# Patient Record
Sex: Female | Born: 1950
Health system: Southern US, Community
[De-identification: ages and names within clinical notes are randomized; demographics above are authoritative.]

## PROBLEM LIST (undated history)

## (undated) ENCOUNTER — Ambulatory Visit

## (undated) DIAGNOSIS — C50919 Malignant neoplasm of unspecified site of unspecified female breast: Secondary | ICD-10-CM

## (undated) DIAGNOSIS — E119 Type 2 diabetes mellitus without complications: Secondary | ICD-10-CM

## (undated) DIAGNOSIS — I249 Acute ischemic heart disease, unspecified: Secondary | ICD-10-CM

## (undated) HISTORY — DX: Malignant neoplasm of unspecified site of unspecified female breast: C50.919

## (undated) HISTORY — PX: MASTECTOMY: SHX3

---

## 1997-06-29 DIAGNOSIS — C50919 Malignant neoplasm of unspecified site of unspecified female breast: Secondary | ICD-10-CM

## 1997-06-29 HISTORY — DX: Malignant neoplasm of unspecified site of unspecified female breast: C50.919

## 1999-07-16 ENCOUNTER — Other Ambulatory Visit: Admission: RE | Admit: 1999-07-16 | Discharge: 1999-07-16 | Payer: Self-pay | Admitting: Family Medicine

## 1999-07-30 ENCOUNTER — Other Ambulatory Visit: Admission: RE | Admit: 1999-07-30 | Discharge: 1999-07-30 | Payer: Self-pay | Admitting: *Deleted

## 1999-09-02 ENCOUNTER — Other Ambulatory Visit: Admission: RE | Admit: 1999-09-02 | Discharge: 1999-09-02 | Payer: Self-pay | Admitting: *Deleted

## 2000-02-05 ENCOUNTER — Encounter: Payer: Self-pay | Admitting: Hematology and Oncology

## 2000-02-05 ENCOUNTER — Encounter: Admission: RE | Admit: 2000-02-05 | Discharge: 2000-02-05 | Payer: Self-pay | Admitting: Hematology and Oncology

## 2000-02-06 ENCOUNTER — Encounter: Payer: Self-pay | Admitting: Hematology and Oncology

## 2000-02-06 ENCOUNTER — Encounter: Admission: RE | Admit: 2000-02-06 | Discharge: 2000-02-06 | Payer: Self-pay | Admitting: Hematology and Oncology

## 2001-04-06 ENCOUNTER — Encounter: Admission: RE | Admit: 2001-04-06 | Discharge: 2001-04-06 | Payer: Self-pay | Admitting: Hematology and Oncology

## 2001-04-06 ENCOUNTER — Encounter: Payer: Self-pay | Admitting: Hematology and Oncology

## 2001-04-08 ENCOUNTER — Encounter: Payer: Self-pay | Admitting: Hematology and Oncology

## 2001-04-08 ENCOUNTER — Encounter: Admission: RE | Admit: 2001-04-08 | Discharge: 2001-04-08 | Payer: Self-pay | Admitting: Hematology and Oncology

## 2002-05-14 ENCOUNTER — Emergency Department (HOSPITAL_COMMUNITY): Admission: EM | Admit: 2002-05-14 | Discharge: 2002-05-15 | Payer: Self-pay | Admitting: Emergency Medicine

## 2002-05-17 ENCOUNTER — Emergency Department (HOSPITAL_COMMUNITY): Admission: EM | Admit: 2002-05-17 | Discharge: 2002-05-17 | Payer: Self-pay | Admitting: Emergency Medicine

## 2003-03-30 ENCOUNTER — Encounter: Payer: Self-pay | Admitting: Hematology & Oncology

## 2003-03-30 ENCOUNTER — Ambulatory Visit (HOSPITAL_COMMUNITY): Admission: RE | Admit: 2003-03-30 | Discharge: 2003-03-30 | Payer: Self-pay | Admitting: Hematology & Oncology

## 2003-08-02 ENCOUNTER — Encounter: Admission: RE | Admit: 2003-08-02 | Discharge: 2003-08-02 | Payer: Self-pay | Admitting: Hematology & Oncology

## 2004-03-13 ENCOUNTER — Encounter (INDEPENDENT_AMBULATORY_CARE_PROVIDER_SITE_OTHER): Payer: Self-pay | Admitting: Radiology

## 2004-03-13 ENCOUNTER — Encounter: Admission: RE | Admit: 2004-03-13 | Discharge: 2004-03-13 | Payer: Self-pay | Admitting: Hematology & Oncology

## 2004-03-19 ENCOUNTER — Encounter (HOSPITAL_COMMUNITY): Admission: RE | Admit: 2004-03-19 | Discharge: 2004-06-17 | Payer: Self-pay | Admitting: Surgery

## 2004-04-04 ENCOUNTER — Ambulatory Visit (HOSPITAL_COMMUNITY): Admission: RE | Admit: 2004-04-04 | Discharge: 2004-04-04 | Payer: Self-pay | Admitting: Hematology & Oncology

## 2004-04-08 ENCOUNTER — Encounter: Admission: RE | Admit: 2004-04-08 | Discharge: 2004-04-08 | Payer: Self-pay | Admitting: Hematology & Oncology

## 2004-04-22 ENCOUNTER — Encounter (INDEPENDENT_AMBULATORY_CARE_PROVIDER_SITE_OTHER): Payer: Self-pay | Admitting: *Deleted

## 2004-04-22 ENCOUNTER — Inpatient Hospital Stay (HOSPITAL_COMMUNITY): Admission: RE | Admit: 2004-04-22 | Discharge: 2004-04-25 | Payer: Self-pay | Admitting: General Surgery

## 2004-05-10 ENCOUNTER — Ambulatory Visit: Payer: Self-pay | Admitting: Hematology & Oncology

## 2004-05-14 ENCOUNTER — Ambulatory Visit (HOSPITAL_COMMUNITY): Admission: RE | Admit: 2004-05-14 | Discharge: 2004-05-14 | Payer: Self-pay | Admitting: Hematology & Oncology

## 2004-05-18 ENCOUNTER — Emergency Department (HOSPITAL_COMMUNITY): Admission: EM | Admit: 2004-05-18 | Discharge: 2004-05-19 | Payer: Self-pay | Admitting: *Deleted

## 2004-06-27 ENCOUNTER — Ambulatory Visit: Payer: Self-pay | Admitting: Hematology & Oncology

## 2004-06-29 HISTORY — PX: MASTECTOMY: SHX3

## 2004-09-25 ENCOUNTER — Ambulatory Visit: Payer: Self-pay | Admitting: Hematology & Oncology

## 2005-01-21 ENCOUNTER — Ambulatory Visit (HOSPITAL_COMMUNITY): Admission: RE | Admit: 2005-01-21 | Discharge: 2005-01-21 | Payer: Self-pay | Admitting: Plastic Surgery

## 2005-01-21 ENCOUNTER — Ambulatory Visit (HOSPITAL_BASED_OUTPATIENT_CLINIC_OR_DEPARTMENT_OTHER): Admission: RE | Admit: 2005-01-21 | Discharge: 2005-01-22 | Payer: Self-pay | Admitting: Plastic Surgery

## 2005-01-21 ENCOUNTER — Encounter (INDEPENDENT_AMBULATORY_CARE_PROVIDER_SITE_OTHER): Payer: Self-pay | Admitting: *Deleted

## 2005-01-30 ENCOUNTER — Ambulatory Visit: Payer: Self-pay | Admitting: Hematology & Oncology

## 2005-03-30 ENCOUNTER — Ambulatory Visit: Payer: Self-pay | Admitting: Hematology & Oncology

## 2005-07-07 ENCOUNTER — Ambulatory Visit: Payer: Self-pay | Admitting: Hematology & Oncology

## 2005-09-29 ENCOUNTER — Ambulatory Visit: Payer: Self-pay | Admitting: Hematology & Oncology

## 2005-09-29 ENCOUNTER — Ambulatory Visit (HOSPITAL_COMMUNITY): Admission: RE | Admit: 2005-09-29 | Discharge: 2005-09-29 | Payer: Self-pay | Admitting: Hematology & Oncology

## 2005-09-29 LAB — CREATININE, SERUM: Creatinine, Ser: 0.9 mg/dL (ref 0.4–1.2)

## 2006-02-12 ENCOUNTER — Encounter: Admission: RE | Admit: 2006-02-12 | Discharge: 2006-02-12 | Payer: Self-pay | Admitting: Hematology & Oncology

## 2006-08-29 ENCOUNTER — Emergency Department (HOSPITAL_COMMUNITY): Admission: EM | Admit: 2006-08-29 | Discharge: 2006-08-29 | Payer: Self-pay | Admitting: Emergency Medicine

## 2006-09-21 ENCOUNTER — Ambulatory Visit: Payer: Self-pay | Admitting: Hematology & Oncology

## 2006-09-23 LAB — CBC WITH DIFFERENTIAL/PLATELET
BASO%: 0.7 % (ref 0.0–2.0)
Eosinophils Absolute: 0.1 10*3/uL (ref 0.0–0.5)
HCT: 38.2 % (ref 34.8–46.6)
LYMPH%: 39.7 % (ref 14.0–48.0)
MONO#: 0.4 10*3/uL (ref 0.1–0.9)
NEUT#: 1.8 10*3/uL (ref 1.5–6.5)
Platelets: 251 10*3/uL (ref 145–400)
RBC: 4.29 10*6/uL (ref 3.70–5.32)
WBC: 3.8 10*3/uL — ABNORMAL LOW (ref 3.9–10.0)
lymph#: 1.5 10*3/uL (ref 0.9–3.3)

## 2006-09-23 LAB — COMPREHENSIVE METABOLIC PANEL
AST: 23 U/L (ref 0–37)
Albumin: 4.2 g/dL (ref 3.5–5.2)
Alkaline Phosphatase: 114 U/L (ref 39–117)
BUN: 10 mg/dL (ref 6–23)
Creatinine, Ser: 0.8 mg/dL (ref 0.40–1.20)
Glucose, Bld: 104 mg/dL — ABNORMAL HIGH (ref 70–99)
Potassium: 3.9 mEq/L (ref 3.5–5.3)
Total Bilirubin: 0.6 mg/dL (ref 0.3–1.2)

## 2007-03-22 ENCOUNTER — Ambulatory Visit: Payer: Self-pay | Admitting: Hematology & Oncology

## 2007-04-01 ENCOUNTER — Encounter: Admission: RE | Admit: 2007-04-01 | Discharge: 2007-04-01 | Payer: Self-pay | Admitting: Hematology & Oncology

## 2007-04-25 LAB — CBC WITH DIFFERENTIAL/PLATELET
Basophils Absolute: 0 10*3/uL (ref 0.0–0.1)
EOS%: 2.4 % (ref 0.0–7.0)
Eosinophils Absolute: 0.1 10*3/uL (ref 0.0–0.5)
HCT: 38.9 % (ref 34.8–46.6)
HGB: 13.6 g/dL (ref 11.6–15.9)
MCH: 31.4 pg (ref 26.0–34.0)
MCV: 89.7 fL (ref 81.0–101.0)
MONO%: 7.8 % (ref 0.0–13.0)
NEUT#: 2.1 10*3/uL (ref 1.5–6.5)
NEUT%: 48.1 % (ref 39.6–76.8)
Platelets: 241 10*3/uL (ref 145–400)
RDW: 14.6 % — ABNORMAL HIGH (ref 11.3–14.5)

## 2007-04-25 LAB — COMPREHENSIVE METABOLIC PANEL
Albumin: 4.3 g/dL (ref 3.5–5.2)
Alkaline Phosphatase: 92 U/L (ref 39–117)
BUN: 13 mg/dL (ref 6–23)
Calcium: 9.4 mg/dL (ref 8.4–10.5)
Chloride: 106 mEq/L (ref 96–112)
Creatinine, Ser: 0.84 mg/dL (ref 0.40–1.20)
Glucose, Bld: 124 mg/dL — ABNORMAL HIGH (ref 70–99)
Potassium: 4.3 mEq/L (ref 3.5–5.3)

## 2007-08-17 ENCOUNTER — Ambulatory Visit: Payer: Self-pay | Admitting: Hematology & Oncology

## 2007-08-19 LAB — COMPREHENSIVE METABOLIC PANEL
Albumin: 4.4 g/dL (ref 3.5–5.2)
BUN: 11 mg/dL (ref 6–23)
Calcium: 9.8 mg/dL (ref 8.4–10.5)
Chloride: 106 mEq/L (ref 96–112)
Glucose, Bld: 109 mg/dL — ABNORMAL HIGH (ref 70–99)
Potassium: 4.1 mEq/L (ref 3.5–5.3)
Sodium: 140 mEq/L (ref 135–145)
Total Protein: 7.4 g/dL (ref 6.0–8.3)

## 2007-08-19 LAB — CBC WITH DIFFERENTIAL/PLATELET
Basophils Absolute: 0 10*3/uL (ref 0.0–0.1)
Eosinophils Absolute: 0.1 10*3/uL (ref 0.0–0.5)
HGB: 13.6 g/dL (ref 11.6–15.9)
MONO#: 0.4 10*3/uL (ref 0.1–0.9)
NEUT#: 2.2 10*3/uL (ref 1.5–6.5)
RDW: 14.1 % (ref 11.3–14.5)
WBC: 4.1 10*3/uL (ref 3.9–10.0)
lymph#: 1.5 10*3/uL (ref 0.9–3.3)

## 2007-08-19 LAB — IRON AND TIBC
Iron: 113 ug/dL (ref 42–145)
UIBC: 234 ug/dL

## 2007-08-20 LAB — TSH: TSH: 0.984 u[IU]/mL (ref 0.350–5.500)

## 2007-10-25 ENCOUNTER — Ambulatory Visit: Payer: Self-pay | Admitting: Hematology & Oncology

## 2007-10-27 LAB — COMPREHENSIVE METABOLIC PANEL
Albumin: 4.3 g/dL (ref 3.5–5.2)
Alkaline Phosphatase: 97 U/L (ref 39–117)
BUN: 11 mg/dL (ref 6–23)
CO2: 25 mEq/L (ref 19–32)
Calcium: 9.5 mg/dL (ref 8.4–10.5)
Glucose, Bld: 96 mg/dL (ref 70–99)
Potassium: 3.9 mEq/L (ref 3.5–5.3)

## 2007-10-27 LAB — CBC WITH DIFFERENTIAL/PLATELET
Basophils Absolute: 0 10*3/uL (ref 0.0–0.1)
Eosinophils Absolute: 0.1 10*3/uL (ref 0.0–0.5)
HGB: 13.8 g/dL (ref 11.6–15.9)
MCV: 90.7 fL (ref 81.0–101.0)
MONO%: 7.4 % (ref 0.0–13.0)
NEUT#: 2.3 10*3/uL (ref 1.5–6.5)
Platelets: 273 10*3/uL (ref 145–400)
RDW: 14.4 % (ref 11.3–14.5)

## 2008-02-18 ENCOUNTER — Emergency Department (HOSPITAL_COMMUNITY): Admission: EM | Admit: 2008-02-18 | Discharge: 2008-02-18 | Payer: Self-pay | Admitting: Emergency Medicine

## 2008-03-14 ENCOUNTER — Ambulatory Visit: Payer: Self-pay | Admitting: Hematology & Oncology

## 2008-04-18 ENCOUNTER — Encounter: Admission: RE | Admit: 2008-04-18 | Discharge: 2008-04-18 | Payer: Self-pay | Admitting: Hematology & Oncology

## 2008-07-17 ENCOUNTER — Ambulatory Visit: Payer: Self-pay | Admitting: Hematology & Oncology

## 2009-01-15 ENCOUNTER — Ambulatory Visit: Payer: Self-pay | Admitting: Hematology & Oncology

## 2009-01-16 LAB — CBC WITH DIFFERENTIAL (CANCER CENTER ONLY)
BASO#: 0 10*3/uL (ref 0.0–0.2)
Eosinophils Absolute: 0.1 10*3/uL (ref 0.0–0.5)
HCT: 39.7 % (ref 34.8–46.6)
HGB: 13.3 g/dL (ref 11.6–15.9)
LYMPH#: 2 10*3/uL (ref 0.9–3.3)
NEUT#: 2.6 10*3/uL (ref 1.5–6.5)
NEUT%: 51.7 % (ref 39.6–80.0)
RBC: 4.33 10*6/uL (ref 3.70–5.32)

## 2009-01-16 LAB — COMPREHENSIVE METABOLIC PANEL
AST: 22 U/L (ref 0–37)
Alkaline Phosphatase: 102 U/L (ref 39–117)
BUN: 9 mg/dL (ref 6–23)
Calcium: 9.7 mg/dL (ref 8.4–10.5)
Creatinine, Ser: 0.8 mg/dL (ref 0.40–1.20)

## 2009-04-05 ENCOUNTER — Observation Stay (HOSPITAL_COMMUNITY): Admission: EM | Admit: 2009-04-05 | Discharge: 2009-04-06 | Payer: Self-pay | Admitting: Emergency Medicine

## 2009-07-16 ENCOUNTER — Ambulatory Visit: Payer: Self-pay | Admitting: Hematology & Oncology

## 2009-07-17 LAB — CBC WITH DIFFERENTIAL (CANCER CENTER ONLY)
BASO#: 0 10*3/uL (ref 0.0–0.2)
Eosinophils Absolute: 0.2 10*3/uL (ref 0.0–0.5)
HGB: 13.6 g/dL (ref 11.6–15.9)
LYMPH%: 45.5 % (ref 14.0–48.0)
MCH: 31.4 pg (ref 26.0–34.0)
MCV: 92 fL (ref 81–101)
MONO%: 4.5 % (ref 0.0–13.0)
RBC: 4.34 10*6/uL (ref 3.70–5.32)

## 2009-07-17 LAB — COMPREHENSIVE METABOLIC PANEL
Albumin: 4.5 g/dL (ref 3.5–5.2)
Alkaline Phosphatase: 97 U/L (ref 39–117)
BUN: 10 mg/dL (ref 6–23)
Glucose, Bld: 129 mg/dL — ABNORMAL HIGH (ref 70–99)
Potassium: 3.9 mEq/L (ref 3.5–5.3)
Total Bilirubin: 0.4 mg/dL (ref 0.3–1.2)

## 2009-07-17 LAB — VITAMIN D 25 HYDROXY (VIT D DEFICIENCY, FRACTURES): Vit D, 25-Hydroxy: 12 ng/mL — ABNORMAL LOW (ref 30–89)

## 2009-12-31 ENCOUNTER — Ambulatory Visit: Payer: Self-pay | Admitting: Hematology & Oncology

## 2010-01-01 LAB — COMPREHENSIVE METABOLIC PANEL
ALT: 28 U/L (ref 0–35)
Albumin: 4.5 g/dL (ref 3.5–5.2)
CO2: 22 mEq/L (ref 19–32)
Calcium: 9.8 mg/dL (ref 8.4–10.5)
Chloride: 105 mEq/L (ref 96–112)
Glucose, Bld: 110 mg/dL — ABNORMAL HIGH (ref 70–99)
Potassium: 4.2 mEq/L (ref 3.5–5.3)
Sodium: 138 mEq/L (ref 135–145)
Total Bilirubin: 0.3 mg/dL (ref 0.3–1.2)
Total Protein: 7.3 g/dL (ref 6.0–8.3)

## 2010-01-01 LAB — CBC WITH DIFFERENTIAL (CANCER CENTER ONLY)
Eosinophils Absolute: 0.2 10*3/uL (ref 0.0–0.5)
LYMPH#: 2.1 10*3/uL (ref 0.9–3.3)
LYMPH%: 44.2 % (ref 14.0–48.0)
MCV: 93 fL (ref 81–101)
MONO#: 0.3 10*3/uL (ref 0.1–0.9)
Platelets: 259 10*3/uL (ref 145–400)
RBC: 4.3 10*6/uL (ref 3.70–5.32)
WBC: 4.7 10*3/uL (ref 3.9–10.0)

## 2010-01-01 LAB — VITAMIN D 25 HYDROXY (VIT D DEFICIENCY, FRACTURES): Vit D, 25-Hydroxy: 18 ng/mL — ABNORMAL LOW (ref 30–89)

## 2010-01-08 ENCOUNTER — Encounter: Admission: RE | Admit: 2010-01-08 | Discharge: 2010-01-08 | Payer: Self-pay | Admitting: Hematology & Oncology

## 2010-07-04 ENCOUNTER — Ambulatory Visit: Payer: Self-pay | Admitting: Hematology & Oncology

## 2010-07-07 ENCOUNTER — Encounter: Payer: Self-pay | Admitting: Gastroenterology

## 2010-07-07 LAB — VITAMIN D 25 HYDROXY (VIT D DEFICIENCY, FRACTURES): Vit D, 25-Hydroxy: 14 ng/mL — ABNORMAL LOW (ref 30–89)

## 2010-07-19 ENCOUNTER — Encounter: Payer: Self-pay | Admitting: Hematology & Oncology

## 2010-07-30 ENCOUNTER — Encounter (INDEPENDENT_AMBULATORY_CARE_PROVIDER_SITE_OTHER): Payer: Self-pay | Admitting: *Deleted

## 2010-07-31 ENCOUNTER — Encounter: Payer: Self-pay | Admitting: Gastroenterology

## 2010-07-31 ENCOUNTER — Ambulatory Visit: Admit: 2010-07-31 | Payer: Self-pay | Admitting: Gastroenterology

## 2010-07-31 NOTE — Letter (Signed)
Summary: Pre Visit Letter Revised  [facility]  [address] [place], [place] [address]   Phone: [phone]  Fax: [phone]        07/07/2010 MRN: [id number]  [facility] [address] [name], [place]  [address]             Procedure Date:  2-15 10am            Direct Colon  Dr Russella Dar   Welcome to the Gastroenterology Division at [facility].    You are scheduled to see a nurse for your pre-procedure visit on 07-31-2010 at 1:30pm on the 3rd floor at [facility], [address]  We ask that you try to arrive at our office 15 minutes prior to your appointment time to allow for check-in.  Please take a minute to review the attached form.  If you answer "Yes" to one or more of the questions on the first page, we ask that you call the person listed at your earliest opportunity.  If you answer "No" to all of the questions, please complete the rest of the form and bring it to your appointment.    Your nurse visit will consist of discussing your medical and surgical history, your immediate family medical history, and your medications.   If you are unable to list all of your medications on the form, please bring the medication bottles to your appointment and we will list them.  We will need to be aware of both prescribed and over the counter drugs.  We will need to know exact dosage information as well.    Please be prepared to read and sign documents such as consent forms, a financial agreement, and acknowledgement forms.  If necessary, and with your consent, a friend or relative is welcome to sit-in on the nurse visit with you.  Please bring your insurance card so that we may make a copy of it.  If your insurance requires a referral to see a specialist, please bring your referral form from your primary care physician.  No co-pay is required for this nurse visit.     If you cannot keep your appointment, please call [phone] to cancel or reschedule  prior to your appointment date.  This allows [place] the opportunity to schedule an appointment for another patient in need of care.    Thank you for choosing [facility] for your medical needs.  We appreciate the opportunity to care for you.  Please visit [place] at our website  to learn more about our practice.  Sincerely, The Gastroenterology Division

## 2010-08-06 NOTE — Letter (Signed)
Summary: [facility] Instructions  [facility]  [address] [place], [place] [address]   Phone: [phone]  Fax: [phone]       [name] [name]    August 01, 1960    MRN: [id number]        Procedure Day Dorna Bloom:  [facility]  08/13/10     Arrival Time:  9:00AM     Procedure Time:  10:00AM     Location of Procedure:                    _ X_  [facility] (4th Floor)                      PREPARATION FOR COLONOSCOPY WITH MOVIPREP   Starting 5 days prior to your procedure 08/08/10 do not eat nuts, seeds, popcorn, corn, beans, peas,  salads, or any raw vegetables.  Do not take any fiber supplements (e.g. Metamucil, Citrucel, and Benefiber).  THE DAY BEFORE YOUR PROCEDURE         DATE: 08/12/10   DAY: TUESDAY  1.  Drink clear liquids the entire day-NO SOLID FOOD  2.  Do not drink anything colored red or purple.  Avoid juices with pulp.  No orange juice.  3.  Drink at least 64 oz. (8 glasses) of fluid/clear liquids during the day to prevent dehydration and help the prep work efficiently.  CLEAR LIQUIDS INCLUDE: Water Jello Ice Popsicles Tea (sugar ok, no milk/cream) Powdered fruit flavored drinks Coffee (sugar ok, no milk/cream) Gatorade Juice: apple, white grape, white cranberry  Lemonade Clear bullion, consomm, broth Carbonated beverages (any kind) Strained chicken noodle soup Hard Candy                             4.  In the morning, mix first dose of MoviPrep solution:    Empty 1 Pouch A and 1 Pouch B into the disposable container    Add lukewarm drinking water to the top line of the container. Mix to dissolve    Refrigerate (mixed solution should be used within 24 hrs)  5.  Begin drinking the prep at 5:00 p.m. The MoviPrep container is divided by 4 marks.   Every 15 minutes drink the solution down to the next mark (approximately 8 oz) until the full liter is complete.   6.  Follow completed prep with 16 oz of clear liquid of your choice  (Nothing red or purple).  Continue to drink clear liquids until bedtime.  7.  Before going to bed, mix second dose of MoviPrep solution:    Empty 1 Pouch A and 1 Pouch B into the disposable container    Add lukewarm drinking water to the top line of the container. Mix to dissolve    Refrigerate  THE DAY OF YOUR PROCEDURE      DATE: 08/13/10   DAY: WEDNESDAY  Beginning at 5:00PM (5 hours before procedure):         1. Every 15 minutes, drink the solution down to the next mark (approx 8 oz) until the full liter is complete.  2. Follow completed prep with 16 oz. of clear liquid of your choice.    3. You may drink clear liquids until 8:00AM (2 HOURS BEFORE PROCEDURE).   MEDICATION INSTRUCTIONS  Unless otherwise instructed, you should take regular prescription medications with a small sip of water   as early as possible the morning  of your procedure.         OTHER INSTRUCTIONS  You will need a responsible adult at least 61 years of age to accompany you and drive you home.   This person must remain in the waiting room during your procedure.  Wear loose fitting clothing that is easily removed.  Leave jewelry and other valuables at home.  However, you may wish to bring a book to read or  an iPod/MP3 player to listen to music as you wait for your procedure to start.  Remove all body piercing jewelry and leave at home.  Total time from sign-in until discharge is approximately 2-3 hours.  You should go home directly after your procedure and rest.  You can resume normal activities the  day after your procedure.  The day of your procedure you should not:   Drive   Make legal decisions   Operate machinery   Drink alcohol   Return to work  You will receive specific instructions about eating, activities and medications before you leave.    The above instructions have been reviewed and explained to me by   Wyona Almas RN  July 31, 2010 1:46 PM     I fully  understand and can verbalize these instructions _____________________________ Date _________

## 2010-08-06 NOTE — Miscellaneous (Signed)
Summary: [facility] PREVISIT/PREP  Clinical Lists Changes  Medications: Added new medication of MOVIPREP 100 GM  SOLR (PEG-KCL-NACL-NASULF-NA ASC-C) As per prep instructions. - Signed Rx of MOVIPREP 100 GM  SOLR (PEG-KCL-NACL-NASULF-NA ASC-C) As per prep instructions.;  #1 x 0;  Signed;  Entered by: Wyona Almas RN;  Authorized by: Meryl Dare MD [facility];  Method used: Print then Give to Patient Observations: Added new observation of NKA: T (07/31/2010 13:23)    Prescriptions: MOVIPREP 100 GM  SOLR (PEG-KCL-NACL-NASULF-NA ASC-C) As per prep instructions.  #1 x 0   Entered by:   Wyona Almas RN   Authorized by:   Meryl Dare MD [facility]   Signed by:   Wyona Almas RN on 07/31/2010   Method used:   Print then Give to Patient   RxID:   [id number]

## 2010-08-13 ENCOUNTER — Encounter: Payer: Self-pay | Admitting: Gastroenterology

## 2010-08-13 ENCOUNTER — Other Ambulatory Visit (AMBULATORY_SURGERY_CENTER): Payer: BC Managed Care – PPO | Admitting: Gastroenterology

## 2010-08-13 DIAGNOSIS — Z1211 Encounter for screening for malignant neoplasm of colon: Secondary | ICD-10-CM

## 2010-08-13 HISTORY — PX: COLONOSCOPY: SHX174

## 2010-08-14 ENCOUNTER — Other Ambulatory Visit: Payer: Self-pay | Admitting: Hematology & Oncology

## 2010-08-14 DIAGNOSIS — Z853 Personal history of malignant neoplasm of breast: Secondary | ICD-10-CM

## 2010-08-20 NOTE — Procedures (Signed)
Summary: Colonoscopy  Patient: Tiwatope Emmitt Note: All result statuses are Final unless otherwise noted.  Tests: (1) Colonoscopy (COL)   COL Colonoscopy           DONE     [facility]     [address]     [place], [place]  [address]           COLONOSCOPY PROCEDURE REPORT           PATIENT:  Genola, Yuille  MR#:  [id number]     BIRTHDATE:  Jan 02, 1951, 59 yrs. old  GENDER:  female     ENDOSCOPIST:  Judie Petit T. Russella Dar, MD, [facility]     Referred by:  Arlan Organ, M.D.     PROCEDURE DATE:  08/13/2010     PROCEDURE:  Colonoscopy 19147     ASA CLASS:  Class II     INDICATIONS:  1) Routine Risk Screening     MEDICATIONS:   Fentanyl 100 mcg IV, Versed 8 mg IV     DESCRIPTION OF PROCEDURE:   After the risks benefits and     alternatives of the procedure were thoroughly explained, informed     consent was obtained.  Digital rectal exam was performed and     revealed no abnormalities.  The LB [id number]-[id number] endoscope     was introduced through the anus and advanced to the cecum, which     was identified by both the appendix and ileocecal valve, limited     by a tortuous colon. The quality of the prep was excellent, using     MoviPrep.  The instrument was then slowly withdrawn as the colon     was fully examined.     <<PROCEDUREIMAGES>>     FINDINGS:  A normal appearing cecum, ileocecal valve, and     appendiceal orifice were identified. The ascending, hepatic     flexure, transverse, splenic flexure, descending, sigmoid colon,     and rectum appeared unremarkable. Retroflexed views in the rectum     revealed no abnormalities. The time to cecum =  5.25  minutes. The     scope was then withdrawn (time =  12  min) from the patient and     the procedure completed.           COMPLICATIONS:  None           ENDOSCOPIC IMPRESSION:     1) Normal colon           RECOMMENDATIONS:     1) Continue current colorectal screening for "routine risk"     patients with a repeat  colonoscopy in 10 years.           Venita Lick. Russella Dar, MD, [facility]           n.     eSIGNED:   Venita Lick. Stark at 08/13/2010 10:38 AM           Bubba Camp, [id number]  Note: An exclamation mark (!) indicates a result that was not dispersed into the flowsheet. Document Creation Date: 08/13/2010 10:39 AM _______________________________________________________________________  (1) Order result status: Final Collection or observation date-time: 08/13/2010 10:32 Requested date-time:  Receipt date-time:  Reported date-time:  Referring Physician:   Ordering Physician: Claudette Head [phone]) Specimen Source:  Source: Launa Grill Order Number: [id number] Lab site:   Appended Document: Colonoscopy    Clinical Lists Changes  Observations: Added new observation of COLONNXTDUE: 07/2020 (08/13/2010 16:55)

## 2010-10-02 LAB — CBC
HCT: 37.1 % (ref 36.0–46.0)
Hemoglobin: 12.5 g/dL (ref 12.0–15.0)
Hemoglobin: 12.7 g/dL (ref 12.0–15.0)
MCHC: 34.1 g/dL (ref 30.0–36.0)
MCV: 93.7 fL (ref 78.0–100.0)
MCV: 94.5 fL (ref 78.0–100.0)
RBC: 3.93 MIL/uL (ref 3.87–5.11)
RDW: 14.6 % (ref 11.5–15.5)
WBC: 5 10*3/uL (ref 4.0–10.5)

## 2010-10-02 LAB — DIFFERENTIAL
Eosinophils Absolute: 0.1 10*3/uL (ref 0.0–0.7)
Lymphs Abs: 1.7 10*3/uL (ref 0.7–4.0)
Monocytes Absolute: 0.4 10*3/uL (ref 0.1–1.0)
Monocytes Relative: 9 % (ref 3–12)
Neutro Abs: 2.7 10*3/uL (ref 1.7–7.7)
Neutrophils Relative %: 54 % (ref 43–77)

## 2010-10-02 LAB — BASIC METABOLIC PANEL
BUN: 10 mg/dL (ref 6–23)
Calcium: 9.4 mg/dL (ref 8.4–10.5)
Creatinine, Ser: 0.81 mg/dL (ref 0.4–1.2)
GFR calc Af Amer: >60 mL/min (ref >60)

## 2010-10-02 LAB — POCT CARDIAC MARKERS
CKMB, poc: 3.9 ng/mL (ref 1.0–8.0)
Myoglobin, poc: 81.6 ng/mL (ref 12–200)
Myoglobin, poc: 87.7 ng/mL (ref 12–200)

## 2010-10-02 LAB — LIPID PANEL
Cholesterol: 231 mg/dL — ABNORMAL HIGH (ref 0–200)
LDL Cholesterol: 153 mg/dL — ABNORMAL HIGH (ref 0–99)

## 2010-10-02 LAB — HEMOGLOBIN A1C: Mean Plasma Glucose: 140 mg/dL

## 2010-10-02 LAB — TROPONIN I: Troponin I: 0.01 ng/mL (ref 0.00–0.06)

## 2010-11-14 NOTE — Discharge Summary (Signed)
NAME:  [name] [name], [name] [name]             ACCOUNT NO.:  [id number]   MEDICAL RECORD NO.:  [id number]          PATIENT TYPE:  INP   LOCATION:  5734                         FACILITY:  [facility]   PHYSICIAN:  Alfredia Ferguson, M.D.  DATE OF BIRTH:  [birth year]   DATE OF ADMISSION:  04/22/2004  DATE OF DISCHARGE:  04/25/2004                                 DISCHARGE SUMMARY   ADMISSION DIAGNOSIS:  Left breast carcinoma, recurrent.   POSTOPERATIVE DIAGNOSIS:  Left breast carcinoma, recurrent.   OPERATION PERFORMED:  1.  Left total mastectomy with sentinel node biopsy.  2.  Immediate breast reconstruction with left transverse rectus abdominis      myocutaneous flap.   CHIEF COMPLAINT:  I have breast cancer that has come back.   HISTORY OF PRESENT ILLNESS:  This is a 60 year old black female who  originally was diagnosed with left breast carcinoma approximately 6 years  prior to this admission.  She was treated with lumpectomy and chemotherapy.  The patient was found on mammogram recently to have new disease which was  biopsied by core needle biopsy and found to be ductal carcinoma.  The  patient is admitted to the hospital at this time for total mastectomy,  sentinel node biopsy and immediate breast reconstruction.   PAST MEDICAL HISTORY:  Negative.   PAST SURGICAL HISTORY:  Lumpectomy approximately 6 years ago in the left  breast.   ALLERGIES:  None.   MEDICATIONS:  None.   PHYSICAL EXAM:  GENERAL:  Please see admission H&P for complete physical  exam.   ADMISSION LABORATORY VALUES:  Admission laboratory values included a  urinalysis which was normal, a CBC, which the patient had a normal  hemoglobin, hematocrit and white count.  She had electrolytes which were  also normal.  Her alkaline phosphatase was elevated at 288.   HOSPITAL COURSE:  On the day of admission, the patient was taken to the  operating room, where she underwent left mastectomy with immediate  reconstruction with a  TRAM flap.  Postoperative course has been completely  uneventful.  The patient was advanced on diet on the first postoperative day  and ambulating on the first postoperative day.  On the second postoperative  day, the patient is relatively independent and able to get to the bathroom  with some assistance.  All incisions look good.  Her reconstructed breast is  soft and of good color.  Drainage is not excessive.  The patient has met  discharge criteria and will be discharged on the 28th of October, on the  third postoperative day.   DISCHARGE INSTRUCTIONS:  Discharge instructions have been provided by my  office.   DISCHARGE MEDICATIONS:  Discharge medications include Vicodin and Keflex.   FOLLOWUP:  Followup will be provided in approximately 5 days following  admission.       WBB/MEDQ  D:  04/24/2004  T:  04/25/2004  Job:  [id number]

## 2010-11-14 NOTE — Op Note (Signed)
NAME:  [name] [name], [name] [name]             ACCOUNT NO.:  [id number]   MEDICAL RECORD NO.:  [id number]          PATIENT TYPE:  AMB   LOCATION:  DSC                          FACILITY:  [facility]   PHYSICIAN:  Alfredia Ferguson, M.D.  DATE OF BIRTH:  February 02, 1951   DATE OF PROCEDURE:  01/21/2005  DATE OF DISCHARGE:                                 OPERATIVE REPORT   PREOP DIAGNOSES:  1.  History of breast cancer.  2.  History of left mastectomy by Dr. Francina Ames.  3.  History of immediate breast reconstruction with TRAM flap.  4.  Fat necrosis of TRAM flap composing approximately 50% of the flap.   POSTOPERATIVE DIAGNOSIS:  Same.   OPERATION PERFORMED:  1.  Debridement of fat necrosis of TRAM.  2.  Placement of subpectoral tissue expander, 350 cc implant inflated to 75      cc at the time of surgery.   SURGEON:  Dr. Benna Dunks   ANESTHESIA:  General laryngeal mask anesthesia.   INDICATIONS FOR SURGERY:  This is a 60 year old woman who was just shy of a  year from left mastectomy and immediate TRAM reconstruction. Her  postoperative course was complicated by vascular compromise to the TRAM with  development of fat necrosis of the medial 25% of the flap and lateral 25% of  the flap.  Progress has stabilized on resolution of fat necrosis. The  patient wishes to now have the remaining fat necrosis removed. She  understands this will be debulking the TRAM by about 50%. The patient is  going to require a tissue expander placed to try to re-expand the  reconstructed breast for placement of permanent implant. The patient was  advised that it would be preferable to do a latissimus flap, but she is not  interested in a latissimus  flap and would like to try the implant  reconstruction. She understands the risk of implants including capsular  contracture, rippling of the implant, infection, bleeding, hematoma, seroma,  malposition of the implant, hardness of the reconstructed breast and overall  dissatisfaction with results.   DESCRIPTION OF SURGERY:  Skin marks were placed outlining natural dimensions  of the breast prior to the patient being taken to the OR. Once in the OR,  she was given general laryngeal mask anesthesia. Chest was prepped with  Betadine and draped with sterile drapes. The original mastectomy incision  was incised and the exposed skin paddle of the TRAM flap was de-  epithelialized. Superior and inferior skin flaps were elevated off of the  TRAM flap. The limits of my fat necrosis were noted and marked. Fat necrosis  was dissected away from the viable TRAM fat. Once dissected away from the  TRAM it was dissected off of the chest wall using electrocautery. The medial  fat necrosis was first removed and measured approximately 10 cm x 3 cm. The  lateral fat necrosis was also identified and marked. It was dissected away  from the remaining viable TRAM fat. Viable tissue was dissected away from  the fat necrosis down to the chest wall. The fat necrosis was now dissected  off of the chest wall from superior to inferior. The segment measured also  approximately 10 cm by about 5 cm.  What was remaining following removal of  the fat necrosis was the central portion of the TRAM which was quite healthy  and had excellent bleeding from the incised edges. I elevated the viable  TRAM off of the chest wall until reaching the rectus muscle. This freed the  superior half of the remaining TRAM tissue. By freeing the superior half  that allowed me to rotate the viable TRAM fat in a counterclockwise position  down into the inferior portion of my mastectomy defect. This filled the  inferior one half of the mastectomy defect. Once rotated the flap was  secured in its new rotated position with multiple interrupted 3-0 Vicryl  sutures. I opted to place tissue expander. My feeling was that the rectus  muscle was quite fibrotic from the previous surgery and putting an implant   underneath would probably not be successful. I incised the pectoralis muscle  in the direction of its fibers for a distance about 8 cm. A subpectoral  space was created using electrocautery large enough to accommodate a 350 cc  tissue expander. The pocket was copiously irrigated with saline irrigation  and hemostasis was meticulously assured. A 350 cc Inamed tissue expander was  prepared by evacuating the air. 75 mL of sterile saline was placed in the  tissue expander. The entire operative site was irrigated with warm saline  irrigation. The tissue expander was placed in a subpectoral position and  placed in the desired location. The pectoralis muscle was reapproximated  with multiple interrupted horizontal mattresses of 3-0 Vicryl suture. 3-0  Blake drain was placed in the lateral axillary limits of my dissection and  brought through separate stab incision. The skin incision was closed under  some tension with multiple interrupted 3-0 Monocryl sutures followed by  running 3-0 Monocryl subcuticular.  Estimated blood loss was only about 50  mL. The patient's chest was cleansed and dried. Light dressing was applied.  The patient was awakened, extubated and transferred to recovery in  satisfactory addition.      Alfredia Ferguson, M.D.  Electronically Signed     WBB/MEDQ  D:  01/21/2005  T:  01/21/2005  Job:  [id number]   cc:   Rose Phi. Young, M.D.  [address], [address]  [place]  [place] [address]

## 2010-11-14 NOTE — Op Note (Signed)
[name], [name]             ACCOUNT NO.:  [id number]   MEDICAL RECORD NO.:  [id number]          PATIENT TYPE:  INP   LOCATION:  2899                         FACILITY:  [facility]   PHYSICIAN:  Alfredia Ferguson, M.D.  DATE OF BIRTH:  1951-04-22   DATE OF PROCEDURE:  04/22/2004  DATE OF DISCHARGE:                                 OPERATIVE REPORT   PREOPERATIVE DIAGNOSES:  1.  History of breast cancer, recurrent.  2.  Acquired absence left breast.   POSTOPERATIVE DIAGNOSES:  1.  History of breast cancer, recurrent.  2.  Acquired absence left breast.   PROCEDURE:  Immediate breast reconstruction with left transverse rectus  abdominis myocutaneous flap, single pedicle flap.   SURGEON:  Alfredia Ferguson, M.D.   FIRST ASSISTANT:  Pam Drown, M.D.   ANESTHESIA:  General endotracheal anesthesia.   INDICATIONS FOR PROCEDURE:  This is a 60 year old black female who has  recurrent left breast carcinoma.  She was originally diagnosed six years ago  and treated with lumpectomy and chemotherapy.  She has now been advised to  undergo mastectomy for the treatment of her recurrent breast cancer.  The  patient wishes to undergo immediate breast reconstruction with a TRAM flap.  She understands the risks of this surgery including bleeding, infection,  hematoma, seroma, unsightly scarring, a reconstructive breast which is too  small or too large compared to the opposite breast, partial fat necrosis,  vascular compromise to the flap with complete loss of the flap, wound  healing problems in the abdomen, seroma in the abdomen, loss of sensation of  the skin, vascular compromise to the umbilicus, dog ears, the need for  revision surgery and overall dissatisfaction with the results.  In spite of  these and other risks discussed, the patient wishes to proceed with surgery.   DESCRIPTION OF PROCEDURE:  On the day prior to surgery, skin marks were  placed to outline the dimensions of the skin  paddle.  Dimensions were also  placed for the skin excision of the mastectomy.  On the day of surgery, the  patient was taken to the operating room where she was given general  endotracheal anesthesia.  The beginning of the surgery was performed by me  because Dr. Maple Hudson was involved in another surgical procedure.  This was as  planned.  The patient's chest and abdomen were prepped with Betadine and  draped with sterile drapes following general endotracheal anesthesia.  A  circular incision was made around the umbilicus and the umbilicus was  dissected away from the surrounding tissue with a healthy cuff of fat to  insure vascular integrity.  The upper skin mark was made along the skin  paddle of the TRAM flap and the upper abdominal flap was dissected off the  anterior rectus fascia and external oblique fascia up to the level of the  costal margins bilaterally and the xyphoid in the midline.  The patient's  back was elevated by lifting the back of the operating table to insure that  I could close the incision at the located mark for the lower skin paddle  incision.  Once I was certain of this.  The patient was placed back in the  supine position and the lower skin paddle incision was made and deepened  until reaching the anterior rectus fascia.  The right lateral corner of the  skin paddle was elevated from lateral to medial direction, lifting it off  the anterior abdominal wall fascia until crossing the midline 1 cm to the  left to the linea alba.  The left corner of the skin paddle was elevated  until reaching the lateral rectus perforators which was approximately 3 cm  medial to the lateral rectus border.  An incision was made in the anterior  rectus fascia along the lateral connection of the skin paddle to the rectus  fascia and this incision was through the anterior rectus fascia until  visualizing the rectus muscle.  An incision was also made at the inferior  connection of the skin  paddle to the rectus fascia visualizing the rectus  muscle.  An incision was made in the medial rectus fascia along the medial  connection of the skin paddle to the rectus fascia.  Two parallel incisions  were made in the upper rectus fascia up to the level of the costal margins  with the two parallel incisions being 2 cm apart.  The lateral incision  continued until connecting  to the previously made incision of the rectus  fascia along the lateral skin flap connection.  The medial was likewise  continued to the medial rectus fascia incision.  At this point, Dr. Maple Hudson  was in the operating room and I temporarily closed the flap incision with  staples and the procedure was turned over to Dr. Maple Hudson.   Following the completion of the mastectomy, I returned to the operating  room.  The anterior rectus fascia was dissected off the rectus muscle using  electrocautery.  The strip of rectus fascia was left connected to the rectus  muscle in the upper portion of the muscle.  The deep surface of the rectus  muscle was dissected out of its anatomic bed.  This freed the muscle  completely with the exception of its attachment to the costal cartilages  superiorly and inferiorly going below the arcuate line.  The deep inferior  epigastric vessels were visualized.  They were visualized inferiorly and  laterally in relation to the muscle.  The artery was dissected away from two  veins and was clipped between multiple hemoclips and divided between the  clips.  The two veins were likewise clipped and divided.  The muscle was  divided just below the arcuate line.  The right side of the skin paddle was  dissected away from the left side.  I opted to keep approximately 3 cm of  the right side of the skin paddle to the right of midline.  I also removed  the left corner of the skin paddle.  This reduced the skin paddle by  approximately 40%.  The connection between the inferior medial dissection of the  mastectomy was made to the superior dissection of the abdominal wound.  Through this connection the skin paddle and rectus muscle were tunneled and  brought up into the mastectomy defect.  A 10 mm Blake drain was placed in  the lateral axillary gutter of the left mastectomy site.  The skin edges of  the mastectomy flap were temporarily stapled.  The abdominal wound was  copiously irrigated with warm saline irrigation.  Hemostasis was  meticulously achieved.  The  rectus fascia was now closed with multiple  interrupted buried figure-of-eight 0 Prolene sutures.  [place] Marlex mesh was  placed over this closure and fixed in position with running 2-0 Prolene  suture.  The wound was again irrigated with saline irrigation.  Hemostasis  was inspected for, and achieved.  Two Blake drains were placed in the  abdominal wound and brought out through separate stab incisions.  An On-Cue  pain pump was placed with double catheters placed in the wound and brought  out through separate stab incisions.  The patient was placed in a semi-  Fowler's position with the back elevated to approximately 30 degrees and the  knees flexed.  The abdominal wound closure was carried out by approximating  the incision in the midline with 2-0 Vicryl sutures.  The remainder of the  incision was temporarily stapled.  A new location was made for the umbilicus  and was incised at this skin mark.  An opening was made until connecting  with the umbilical stump. The umbilicus was brought through this new opening  and fixed in position with multiple interrupted 3-0 Monocryl sutures.  The  abdominal wound was closed with a combination of 2-0 Vicryl sutures for the  dermis and 3-0 Monocryl sutures for the dermis.  The skin edges were closed  with a running 3-0 Monocryl subcuticular.  Attention was directed to the  reconstructed breast.  The previously placed staples were removed and the  TRAM flap was brought through for inspection.   There was noted to be some  venous congestion.  The muscle was inspected and it was a good color.  There  was no tension at the tunnel.  I assume that this was secondary to the  venous choke valves not being open.  The TRAM flap was now cut to fit the  mastectomy site.  Excess tissue was removed.  The skin paddle was  deepithelialized with the exception of the portion of skin I wish to retain.  The upper portion of the paddle was suspended from the superior limits of  the mastectomy dissection with multiple interrupted 3-0 Vicryl sutures.  The  mastectomy incision was now closed with multiple interrupted 3-0 Monocryl  sutures.  The remaining skin on the skin paddle was sutured to the upper and  lower breast flap incisions with multiple interrupted 3-0 Monocryl sutures  followed by running 3-0 Monocryl subcuticular.  The sentinel node incision  in the axilla was closed with multiple interrupted 3-0 Monocryl sutures.  There was also an incision in the upper portion of the breast where the original biopsy site was cored and left connected to the mastectomy  specimen.  This incision was closed in a similar fashion to the sentinel  node incision.  The patient tolerated the procedure well with an estimated  blood  loss of 300 mL.  Her chest and abdomen were cleansed, dried and Steri-Strips  were applied to all incisions.  Light dressings were applied to the breast  and abdomen.  The patient was awakened, extubated and transported to the  recovery room in satisfactory condition.       WBB/MEDQ  D:  04/22/2004  T:  04/22/2004  Job:  [id number]

## 2010-11-14 NOTE — Op Note (Signed)
NAME:  [name] [name], [name] [name]             ACCOUNT NO.:  [id number]   MEDICAL RECORD NO.:  [id number]          PATIENT TYPE:  INP   LOCATION:  2899                         FACILITY:  [facility]   PHYSICIAN:  Rose Phi. Young, M.D.   DATE OF BIRTH:  [birth year]   DATE OF PROCEDURE:  04/22/2004  DATE OF DISCHARGE:                                 OPERATIVE REPORT   PREOPERATIVE DIAGNOSIS:  Recurrent carcinoma of the left breast.   POSTOPERATIVE DIAGNOSIS:  Recurrent carcinoma of the left breast.   OPERATION PERFORMED:  Left total mastectomy with sentinel lymph node biopsy  and blue dye injection.   SURGEON:  Rose Phi. Maple Hudson, M.D.   ASSISTANT:  Anselm Pancoast. Zachery Dakins, M.D.   ANESTHESIA:  General.   DESCRIPTION OF PROCEDURE:  After suitable general anesthesia was induced,  the patient was placed in supine position with the arms extended on the arm  board and the entire abdomen and chest and both breasts were prepped and  draped in the usual fashion.  Dr. Benna Dunks started the work on the flap for  the TRAM reconstruction and then at the time we were available, we came into  the operating room.  I injected 5 mL of a mixture of 2 mL of methylene blue  and 3 mL of injectable saline in the subareolar tissue and gently massaged  the breast for three minutes.   We then made a transverse left axillary incision in the old sentinel lymph  node site and dissected through the subcutaneous tissue to the clavipectoral  fascia and found a hot and blue lymph node which we removed as a sentinel  node.  This was then submitted to the pathologist for evaluation.  There  were no other blue, hot or palpable nodes.  While that was being evaluated,  we had previously outlined what is basically a skin sparing mastectomy and  we made the incision to incorporate in the mastectomy the skin site of the  previous core biopsies.  That was done so that the skin of that would drop  into the mastectomy specimen.  We then made the  elliptical incisions as  previously outlined for the skin sparing mastectomy and then dissected the  medial, superior, inferior and lateral flaps to the sternum, the clavicle,  the inframammary fold at the rectus fascia and laterally to the latissimus  dorsi muscle.  We then removed the breast by dissecting from medial to  lateral incorporating the pectoralis fascia.  At the time we reached the  lateral border of the pectoralis major, the sentinel node report was  available from the pathologist.  It was negative for metastatic disease.  We  then completed the mastectomy by dissecting the lateral chest wall and  removing the breast.  We had hemostasis used with the cautery.   Dr. Benna Dunks then returned to complete the TRAM reconstruction all of which  will be dictated in a separate note.      Pete  PRY/MEDQ  D:  04/22/2004  T:  04/22/2004  Job:  [id number]

## 2011-01-09 ENCOUNTER — Emergency Department (HOSPITAL_COMMUNITY)
Admission: EM | Admit: 2011-01-09 | Discharge: 2011-01-09 | Disposition: A | Discharge status: Home / Self Care | Payer: Self-pay | Attending: Emergency Medicine | Admitting: Emergency Medicine

## 2011-01-09 DIAGNOSIS — R509 Fever, unspecified: Secondary | ICD-10-CM | POA: Insufficient documentation

## 2011-01-09 DIAGNOSIS — R05 Cough: Secondary | ICD-10-CM | POA: Insufficient documentation

## 2011-01-09 DIAGNOSIS — J40 Bronchitis, not specified as acute or chronic: Secondary | ICD-10-CM | POA: Insufficient documentation

## 2011-01-09 DIAGNOSIS — R059 Cough, unspecified: Secondary | ICD-10-CM | POA: Insufficient documentation

## 2011-01-09 DIAGNOSIS — Z853 Personal history of malignant neoplasm of breast: Secondary | ICD-10-CM | POA: Insufficient documentation

## 2011-01-09 DIAGNOSIS — J3489 Other specified disorders of nose and nasal sinuses: Secondary | ICD-10-CM | POA: Insufficient documentation

## 2011-01-13 ENCOUNTER — Ambulatory Visit
Admission: RE | Admit: 2011-01-13 | Discharge: 2011-01-13 | Disposition: A | Discharge status: Home / Self Care | Payer: BC Managed Care – PPO | Source: Ambulatory Visit | Attending: Hematology & Oncology | Admitting: Hematology & Oncology

## 2011-01-13 DIAGNOSIS — Z853 Personal history of malignant neoplasm of breast: Secondary | ICD-10-CM

## 2011-06-16 ENCOUNTER — Telehealth: Payer: Self-pay | Admitting: *Deleted

## 2011-06-16 NOTE — Telephone Encounter (Signed)
Pt called said she was having new problems transferred to RN for triage

## 2011-06-17 ENCOUNTER — Telehealth: Payer: Self-pay | Admitting: *Deleted

## 2011-06-17 ENCOUNTER — Other Ambulatory Visit: Payer: Self-pay | Admitting: *Deleted

## 2011-06-17 NOTE — Telephone Encounter (Signed)
Pt aware of 06-18-11 appointment

## 2011-06-17 NOTE — Telephone Encounter (Signed)
This note was created in error.

## 2011-06-18 ENCOUNTER — Ambulatory Visit (HOSPITAL_BASED_OUTPATIENT_CLINIC_OR_DEPARTMENT_OTHER): Payer: Self-pay | Admitting: Hematology & Oncology

## 2011-06-18 ENCOUNTER — Encounter: Payer: Self-pay | Admitting: Hematology & Oncology

## 2011-06-18 VITALS — BP 124/74 | HR 83 | Temp 97.4°F | Ht 64.0 in | Wt 174.0 lb

## 2011-06-18 DIAGNOSIS — C50919 Malignant neoplasm of unspecified site of unspecified female breast: Secondary | ICD-10-CM | POA: Insufficient documentation

## 2011-06-18 DIAGNOSIS — Z853 Personal history of malignant neoplasm of breast: Secondary | ICD-10-CM

## 2011-06-18 HISTORY — DX: Malignant neoplasm of unspecified site of unspecified female breast: C50.919

## 2011-06-18 NOTE — Progress Notes (Signed)
This office note has been dictated.

## 2011-06-19 ENCOUNTER — Telehealth: Payer: Self-pay | Admitting: *Deleted

## 2011-06-19 NOTE — Telephone Encounter (Signed)
Mailed [phone] schedule

## 2011-06-19 NOTE — Progress Notes (Signed)
CC:   [name] [name], M.D.  DIAGNOSIS:  Locally recurrent ductal carcinoma of the left breast.  CURRENT THERAPY:  Observation.  INTERIM HISTORY:  [name] [name] comes in for her yearly follow-up.  She is doing well.  She had her local recurrence 6 years ago.  She underwent mastectomy of the left breast with reconstruction.  She is doing well.  She has had no problems this year.  She did go for I think on a little vacation.  She has not had any pain.  There is no cough or shortness of breath. She has had no change in bowel or bladder habits.  She has had no headache.  There has been no double vision or blurred vision.  PHYSICAL EXAMINATION:  General Appearance:  This is a well-developed, well-nourished African American female in no obvious distress.  Vital Signs:  Show a temperature of 97.4, pulse 83, respiratory rate 18, blood pressure 124/74.  Weight is 174.  Head and Neck Exam:  Shows a normocephalic, atraumatic skull.  There are no ocular or oral lesions. There are no palpable cervical or supraclavicular lymph nodes.  Lungs: Clear bilaterally.  Cardiac Exam:  Regular rate and rhythm with a normal S1 and S2.  There are no murmurs, rubs or bruits.  Abdominal Exam:  Soft with good bowel sounds.  There is no palpable abdominal mass.  There is no fluid wave.  There is no palpable hepatosplenomegaly.  Back Exam:  No tenderness over the spine, ribs or hips.  Extremities:  Show no clubbing, cyanosis or edema.  Breast Exam:  Shows the right breast with no masses, edema or erythema.  There is no right axillary adenopathy. Left breast shows an implant.  The implant is intact.  No nodularity is noted about the implant.  There is no erythema of the left anterior chest wall.  There is no left axillary adenopathy.  LABORATORY STUDIES:  Labs were not done this visit.  IMPRESSION:  [name] [name] is a 60 year old African American female with a history of locally recurrent infiltrating ductal  carcinoma of the left breast.  She is now out from her recurrence/surgery/chemotherapy by 6 years.  She looks great.  I do not see anything that would suggest a recurrence.  We will go ahead and plan to get her back in 6 months for follow-up.    ______________________________ Josph Macho, M.D. PRE/MEDQ  D:  06/18/2011  T:  06/19/2011  Job:  [id number]

## 2011-08-17 ENCOUNTER — Emergency Department (INDEPENDENT_AMBULATORY_CARE_PROVIDER_SITE_OTHER)
Admission: EM | Admit: 2011-08-17 | Discharge: 2011-08-17 | Disposition: A | Discharge status: Home / Self Care | Payer: Self-pay | Source: Home / Self Care | Attending: Emergency Medicine | Admitting: Emergency Medicine

## 2011-08-17 ENCOUNTER — Encounter (HOSPITAL_COMMUNITY): Payer: Self-pay | Admitting: *Deleted

## 2011-08-17 DIAGNOSIS — W57XXXA Bitten or stung by nonvenomous insect and other nonvenomous arthropods, initial encounter: Secondary | ICD-10-CM

## 2011-08-17 DIAGNOSIS — T148XXA Other injury of unspecified body region, initial encounter: Secondary | ICD-10-CM

## 2011-08-17 DIAGNOSIS — T148 Other injury of unspecified body region: Secondary | ICD-10-CM

## 2011-08-17 MED ORDER — TRIAMCINOLONE ACETONIDE 0.1 % EX CREA: TOPICAL_CREAM | Freq: Two times a day (BID) | CUTANEOUS | Status: DC

## 2011-08-17 MED ORDER — MUPIROCIN 2 % EX OINT: TOPICAL_OINTMENT | Freq: Three times a day (TID) | CUTANEOUS | Status: AC

## 2011-08-17 MED ORDER — FAMOTIDINE 20 MG PO TABS: 20.0000 mg | ORAL_TABLET | Freq: Two times a day (BID) | ORAL | Status: DC

## 2011-08-17 NOTE — Discharge Instructions (Signed)
Take the medication as written. Continue the benadryl. Return if you have a fever >100.4, if you get worse, or for any other concerns.   Insect Bite Mosquitoes, flies, fleas, bedbugs, and many other insects can bite. Insect bites are different from insect stings. A sting is when venom is injected into the skin. Some insect bites can transmit infectious diseases. SYMPTOMS  Insect bites usually turn red, swell, and itch for 2 to 4 days. They often go away on their own. TREATMENT  Your caregiver may prescribe antibiotic medicines if a bacterial infection develops in the bite. HOME CARE INSTRUCTIONS  Do not scratch the bite area.   Keep the bite area clean and dry. Wash the bite area thoroughly with soap and water.   Put ice or cool compresses on the bite area.   Put ice in a plastic bag.   Place a towel between your skin and the bag.   Leave the ice on for 20 minutes, 4 times a day for the first 2 to 3 days, or as directed.   You may apply a baking soda paste, cortisone cream, or calamine lotion to the bite area as directed by your caregiver. This can help reduce itching and swelling.   Only take over-the-counter or prescription medicines as directed by your caregiver.   If you are given antibiotics, take them as directed. Finish them even if you start to feel better.  You may need a tetanus shot if:  You cannot remember when you had your last tetanus shot.   You have never had a tetanus shot.   The injury broke your skin.  If you get a tetanus shot, your arm may swell, get red, and feel warm to the touch. This is common and not a problem. If you need a tetanus shot and you choose not to have one, there is a rare chance of getting tetanus. Sickness from tetanus can be serious. SEEK IMMEDIATE MEDICAL CARE IF:   You have increased pain, redness, or swelling in the bite area.   You see a red line on the skin coming from the bite.   You have a fever.   You have joint pain.   You  have a headache or neck pain.   You have unusual weakness.   You have a rash.   You have chest pain or shortness of breath.   You have abdominal pain, nausea, or vomiting.   You feel unusually tired or sleepy.  Document Released: 07/23/2004 Document Revised: 02/25/2011 Document Reviewed: 01/14/2011 [facility] Patient Information 2012 [place], [place].

## 2011-08-17 NOTE — ED Notes (Signed)
Pt is here with insect bite to right buttocks.  States she was awaken from sleep Friday night with burning like a bee sting.  Developed nausea and itching all over with diarrhea all day today.

## 2011-08-17 NOTE — ED Provider Notes (Signed)
History     CSN: [id number]  Arrival date & time 08/17/11  1133   First MD Initiated Contact with Patient 08/17/11 1237      Chief Complaint  Patient presents with  . Insect Bite    (Consider location/radiation/quality/duration/timing/severity/associated sxs/prior treatment) HPI Comments: Patient reports something "biting" her while in bed 3 nights ago. Reports pain, burning sensation, redness at the area. No other lesions. Nobody else in bed with similar lesions. Did not see what bit her. No fluctuance, discoloration, drainage. No nausea, vomiting, fevers. Reports some nausea starting yesterday, and some loose stools today. No abdominal pain. Has been taking Benadryl for the burning sensation with some improvement. Patient is not a diabetic.  The history is provided by the patient. No language interpreter was used.    Past Medical History  Diagnosis Date  . Recurrent infiltrating ductal carcinoma of breast 06/18/2011    Past Surgical History  Procedure Date  . Mastectomy     History reviewed. No pertinent family history.  History  Substance Use Topics  . Smoking status: Never Smoker   . Smokeless tobacco: Not on file  . Alcohol Use: No    OB History    Grav Para Term Preterm Abortions TAB SAB Ect Mult Living                  Review of Systems  Constitutional: Negative for fever.  Respiratory: Negative for cough and shortness of breath.   Gastrointestinal: Positive for diarrhea. Negative for nausea and vomiting.  Skin: Positive for color change and wound.    Allergies  Review of patient's allergies indicates no known allergies.  Home Medications   Current Outpatient Rx  Name Route Sig Dispense Refill  . DIPHENHYDRAMINE HCL (SLEEP) 25 MG PO TABS Oral Take 25 mg by mouth at bedtime as needed.    Marland Kitchen FAMOTIDINE 20 MG PO TABS Oral Take 1 tablet (20 mg total) by mouth 2 (two) times daily. 10 tablet 0  . MUPIROCIN 2 % EX OINT Topical Apply topically 3 (three)  times daily. Apply after warm soak for 10 minutes 22 g 0  . TRIAMCINOLONE ACETONIDE 0.1 % EX CREA Topical Apply topically 2 (two) times daily. Apply for 2 weeks. May use on face 30 g 0    BP 126/83  Pulse 79  Temp 97.1 F (36.2 C)  Resp 18  SpO2 96%  Physical Exam  Nursing note and vitals reviewed. Constitutional: She is oriented to person, place, and time. She appears well-developed and well-nourished. No distress.  HENT:  Head: Normocephalic and atraumatic.  Eyes: Conjunctivae and EOM are normal.  Neck: Normal range of motion.  Cardiovascular: Normal rate.   Pulmonary/Chest: Effort normal.  Abdominal: She exhibits no distension.  Musculoskeletal: Normal range of motion.  Neurological: She is alert and oriented to person, place, and time.  Skin: Skin is warm and dry.       4 x 4 centimeter area of erythema, swelling right buttocks. No induration, central fluctuance. Skin appears intact. No excoriations, expressible purulent drainage. No other lesions  Psychiatric: She has a normal mood and affect. Her behavior is normal. Judgment and thought content normal.    ED Course  Procedures (including critical care time)  Labs Reviewed - No data to display No results found.   1. Insect bite       MDM  Marked area of erythema with a permanent marker for future reference. Nothing to I&D at this time. Will send  home with antihistamines, steroid cream for symptomatic relief and Bactroban to help prevent secondary infection.  Luiz Blare, MD 08/17/11 1721

## 2011-12-04 ENCOUNTER — Other Ambulatory Visit (HOSPITAL_BASED_OUTPATIENT_CLINIC_OR_DEPARTMENT_OTHER): Payer: Self-pay | Admitting: Lab

## 2011-12-04 ENCOUNTER — Ambulatory Visit: Payer: Self-pay | Admitting: Hematology & Oncology

## 2011-12-08 ENCOUNTER — Telehealth: Payer: Self-pay | Admitting: Hematology & Oncology

## 2011-12-08 NOTE — Telephone Encounter (Signed)
Left patient message to call and reschedule no show from 6-7

## 2011-12-16 ENCOUNTER — Telehealth: Payer: Self-pay | Admitting: Hematology & Oncology

## 2011-12-16 NOTE — Telephone Encounter (Signed)
Pt called and resch 12/04/11 missed apt for 12/22/11

## 2011-12-22 ENCOUNTER — Other Ambulatory Visit: Payer: Self-pay | Admitting: Lab

## 2011-12-22 ENCOUNTER — Ambulatory Visit (HOSPITAL_BASED_OUTPATIENT_CLINIC_OR_DEPARTMENT_OTHER): Payer: Self-pay | Admitting: Hematology & Oncology

## 2011-12-22 VITALS — BP 121/73 | HR 75 | Temp 98.0°F | Ht 64.0 in | Wt 175.0 lb

## 2011-12-22 DIAGNOSIS — Z853 Personal history of malignant neoplasm of breast: Secondary | ICD-10-CM

## 2011-12-22 DIAGNOSIS — C50919 Malignant neoplasm of unspecified site of unspecified female breast: Secondary | ICD-10-CM

## 2011-12-22 DIAGNOSIS — M810 Age-related osteoporosis without current pathological fracture: Secondary | ICD-10-CM

## 2011-12-22 DIAGNOSIS — Z9010 Acquired absence of unspecified breast and nipple: Secondary | ICD-10-CM

## 2011-12-22 LAB — CBC WITH DIFFERENTIAL (CANCER CENTER ONLY)
BASO#: 0 10*3/uL (ref 0.0–0.2)
BASO%: 0.7 % (ref 0.0–2.0)
EOS%: 2.1 % (ref 0.0–7.0)
HGB: 13.6 g/dL (ref 11.6–15.9)
LYMPH#: 2 10*3/uL (ref 0.9–3.3)
MCH: 30.6 pg (ref 26.0–34.0)
MCHC: 34.2 g/dL (ref 32.0–36.0)
MONO%: 8.7 % (ref 0.0–13.0)
NEUT#: 1.9 10*3/uL (ref 1.5–6.5)
Platelets: 240 10*3/uL (ref 145–400)
RDW: 13.8 % (ref 11.1–15.7)

## 2011-12-22 NOTE — Progress Notes (Signed)
This office note has been dictated.

## 2011-12-22 NOTE — Progress Notes (Signed)
CC:   Windle Guard, M.D.  DIAGNOSIS:  Locally recurrent infiltrating ductal carcinoma of the left breast.  CURRENT THERAPY:  Observation.  INTERIM HISTORY:  Ms. Dail comes in for followup.  We see her every 6 months.  She has really done well.  She is going back to school.  She is at [employer].  She made the honor roll.  She will be going back in the fall. She is Glass blower/designer in [employer], [employer].  She has had no problems pain-wise.  She is taking vitamin D.  There is no headache.  She has had no cough or shortness breath.  There is no nausea or vomiting.  There are no fevers, sweats, or chills.  She denies any type of change in bowel or bladder habits.  Her last mammogram was done back over a year ago.  We will have to see why this has not been done recently.  PHYSICAL EXAMINATION:  General:  This is a well-developed, well- nourished black female in no obvious distress.  Vital Signs: Temperature 98, pulse 75, respiratory rate 18, blood pressure 121/73, weight is 175.  Head and Neck Exam:  Shows a normocephalic, atraumatic skull.  There are no ocular or oral lesions.  There are no palpable cervical or supraclavicular lymph nodes.  Lungs:  Clear bilaterally. Cardiac Exam:  Regular rate and rhythm with a normal S1 and S2.  There are no murmurs, rubs, or bruits.  Breast Exam:  Shows the right breast with no masses, edema, or erythema.  There is no right axillary adenopathy.  Left chest wall:  Left breast is reconstructed.  The reconstruction is intact.  No nodules are noted about the left anterior chest wall.  There is no left axillary adenopathy.  Abdomen:  Soft with good bowel sounds.  There is no fluid wave.  There is no palpable hepatosplenomegaly.  Back Exam:  No tenderness over the spine, ribs, or hips.  Extremities:  Show no clubbing, cyanosis, or edema.  Neurological Exam:  Shows no focal neurological deficit.  LABORATORY STUDIES:  White cell count is 4.4, hemoglobin  13.6, hematocrit 39.8, platelet count 240.  IMPRESSION:  Ms. Kopinski is a 61 year old African American female with recurrent ductal carcinoma of the left breast.  She had a recurrence 6- 1/2 years ago.  She has done very, very well.  She was treated with chemotherapy after her mastectomy.  Again, I do not see any evidence of recurrent disease.  We will plan to get her back in 6 more months.  I need to make sure that she does get her mammogram done.    ______________________________ Josph Macho, M.D. PRE/MEDQ  D:  12/22/2011  T:  12/22/2011  Job:  [id number]

## 2011-12-23 LAB — COMPREHENSIVE METABOLIC PANEL
ALT: 37 U/L — ABNORMAL HIGH (ref 0–35)
AST: 20 U/L (ref 0–37)
Albumin: 4.6 g/dL (ref 3.5–5.2)
Alkaline Phosphatase: 96 U/L (ref 39–117)
BUN: 10 mg/dL (ref 6–23)
Calcium: 9.7 mg/dL (ref 8.4–10.5)
Chloride: 103 mEq/L (ref 96–112)
Potassium: 4.1 mEq/L (ref 3.5–5.3)
Sodium: 140 mEq/L (ref 135–145)

## 2011-12-28 ENCOUNTER — Telehealth: Payer: Self-pay | Admitting: *Deleted

## 2011-12-28 NOTE — Telephone Encounter (Signed)
Called patient to let her know that vitamin d level is much better per dr. Lupita Leash

## 2011-12-28 NOTE — Telephone Encounter (Signed)
Message copied by Anselm Jungling on Mon Dec 28, 2011 11:56 AM ------      Message from: Josph Macho      Created: Sun Dec 27, 2011  8:00 PM       Call - vit d is much better.  pete

## 2012-05-04 ENCOUNTER — Emergency Department (INDEPENDENT_AMBULATORY_CARE_PROVIDER_SITE_OTHER)
Admission: EM | Admit: 2012-05-04 | Discharge: 2012-05-04 | Disposition: A | Discharge status: Home / Self Care | Payer: Self-pay | Source: Home / Self Care

## 2012-05-04 ENCOUNTER — Encounter (HOSPITAL_COMMUNITY): Payer: Self-pay

## 2012-05-04 DIAGNOSIS — T59811A Toxic effect of smoke, accidental (unintentional), initial encounter: Secondary | ICD-10-CM

## 2012-05-04 DIAGNOSIS — J705 Respiratory conditions due to smoke inhalation: Secondary | ICD-10-CM

## 2012-05-04 MED ORDER — ALBUTEROL SULFATE HFA 108 (90 BASE) MCG/ACT IN AERS: 2.0000 | INHALATION_SPRAY | RESPIRATORY_TRACT | Status: DC
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
## 2012-05-04 NOTE — ED Notes (Signed)
PULSE  [address]  [address]     SPEAKING IN  COMPLETE  SENTANCES         NO  DISTRESS

## 2012-05-04 NOTE — ED Provider Notes (Signed)
Medical screening examination/treatment/procedure(s) were performed by non-physician practitioner and as supervising physician I was immediately available for consultation/collaboration.  Cayley Pester   Jillian Pianka, MD 05/04/12 1422 
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
## 2012-07-05 ENCOUNTER — Ambulatory Visit: Payer: Self-pay | Admitting: Hematology & Oncology

## 2012-07-05 ENCOUNTER — Other Ambulatory Visit: Payer: Self-pay | Admitting: Lab

## 2012-07-06 ENCOUNTER — Telehealth: Payer: Self-pay | Admitting: Hematology & Oncology

## 2012-07-06 NOTE — Telephone Encounter (Signed)
Pt rescheduled missed 1-6 to 07-25-12. She needed specific days and times. She could only come on mondays, Wednesday and Friday but at different times on those days.

## 2012-07-25 ENCOUNTER — Ambulatory Visit (HOSPITAL_BASED_OUTPATIENT_CLINIC_OR_DEPARTMENT_OTHER): Payer: Self-pay | Admitting: Hematology & Oncology

## 2012-07-25 ENCOUNTER — Other Ambulatory Visit (HOSPITAL_BASED_OUTPATIENT_CLINIC_OR_DEPARTMENT_OTHER): Payer: Self-pay | Admitting: Lab

## 2012-07-25 VITALS — BP 122/59 | HR 93 | Temp 98.2°F | Resp 16 | Ht 64.0 in | Wt 183.0 lb

## 2012-07-25 DIAGNOSIS — M810 Age-related osteoporosis without current pathological fracture: Secondary | ICD-10-CM

## 2012-07-25 DIAGNOSIS — C50919 Malignant neoplasm of unspecified site of unspecified female breast: Secondary | ICD-10-CM

## 2012-07-25 DIAGNOSIS — Z853 Personal history of malignant neoplasm of breast: Secondary | ICD-10-CM

## 2012-07-25 LAB — CBC WITH DIFFERENTIAL (CANCER CENTER ONLY)
Eosinophils Absolute: 0.1 10*3/uL (ref 0.0–0.5)
HCT: 38.2 % (ref 34.8–46.6)
LYMPH%: 35.1 % (ref 14.0–48.0)
MCH: 30.1 pg (ref 26.0–34.0)
MCV: 88 fL (ref 81–101)
MONO#: 0.4 10*3/uL (ref 0.1–0.9)
MONO%: 7.3 % (ref 0.0–13.0)
NEUT%: 55.6 % (ref 39.6–80.0)
RDW: 12.7 % (ref 11.1–15.7)
WBC: 6.1 10*3/uL (ref 3.9–10.0)

## 2012-07-25 NOTE — Progress Notes (Signed)
This office note has been dictated.

## 2012-07-26 LAB — VITAMIN D 25 HYDROXY (VIT D DEFICIENCY, FRACTURES): Vit D, 25-Hydroxy: 49 ng/mL (ref 30–89)

## 2012-07-26 LAB — COMPREHENSIVE METABOLIC PANEL
Alkaline Phosphatase: 114 U/L (ref 39–117)
CO2: 25 mEq/L (ref 19–32)
Creatinine, Ser: 0.92 mg/dL (ref 0.50–1.10)
Glucose, Bld: 375 mg/dL — ABNORMAL HIGH (ref 70–99)
Total Bilirubin: 0.4 mg/dL (ref 0.3–1.2)

## 2012-07-26 NOTE — Progress Notes (Signed)
DIAGNOSIS:  Locally recurrent infiltrating ductal carcinoma of the left breast.  CURRENT THERAPY:  Observation.  INTERIM HISTORY:  [name] [name] comes in for followup.  She is really doing well.  She is back in school.  She made a 4.0 last semester.  She has had no problems with bony pain.  There is no change in bowel or bladder habits.  She has had no leg swelling.  There has been no lymphedema of the left arm.  She has had no bleeding.  There has been no headache.  There has been no double vision or blurred vision.  She is due for her mammogram I think in the summertime.  Last mammogram was back in July.  PHYSICAL EXAMINATION:  General:  This is a well-developed, well- nourished black female in no obvious distress.  Vital signs: Temperature of 98.2, pulse 93, respiratory rate 16, blood pressure 122/59.  Weight is 183.  Head and neck:  Normocephalic, atraumatic skull.  There are no ocular or oral lesions.  There are no palpable cervical or supraclavicular lymph nodes.  Lungs:  Clear bilaterally. Cardiac:  Regular rate and rhythm with a normal S1 and S2.  There are no murmurs, rubs, or bruits.  Abdomen:  Soft with good bowel sounds.  There is no palpable abdominal mass.  There is no palpable hepatosplenomegaly. Breasts:  Right breast no masses, edema, or erythema.  There is no right axillary adenopathy.  Left breast is reconstructed.  She had a mastectomy.  The reconstruction is well taken.  There is no left axillary adenopathy.  Back:  No kyphosis or osteoporotic changes. Extremities:  No clubbing, cyanosis, or edema.  She has good range of motion of her joints.  Neurologic:  No focal neurological deficits. Skin:  No rashes, ecchymosis, or petechia.  Her skin is a little dry.  LABORATORY STUDIES:  White cell count is 6.1, hemoglobin 13.1, hematocrit 38.2, platelet count is 260.  IMPRESSION:  [name] [name] is a very nice 62 year old black female with locally recurrent ductal  carcinoma of the left breast.  She underwent chemotherapy for this.  She had a mastectomy.  She is now out from her recurrence by 7 years.  Again, I do not find any evidence of recurrent disease.  I do not see that we need to do any scans on her.  We will check her lab work.  I told her to take a baby aspirin a day.  I think this is going to be helpful for her.  We will plan see [name] back in another 6 months.    ______________________________ Josph Macho, M.D. PRE/MEDQ  D:  07/25/2012  T:  07/26/2012  Job:  [id number]

## 2012-08-30 ENCOUNTER — Other Ambulatory Visit: Payer: Self-pay

## 2012-08-30 DIAGNOSIS — Z1231 Encounter for screening mammogram for malignant neoplasm of breast: Secondary | ICD-10-CM

## 2012-08-30 DIAGNOSIS — Z9012 Acquired absence of left breast and nipple: Secondary | ICD-10-CM

## 2012-09-06 ENCOUNTER — Ambulatory Visit
Admission: RE | Admit: 2012-09-06 | Discharge: 2012-09-06 | Disposition: A | Discharge status: Home / Self Care | Payer: BC Managed Care – PPO | Source: Ambulatory Visit

## 2012-09-06 DIAGNOSIS — Z1231 Encounter for screening mammogram for malignant neoplasm of breast: Secondary | ICD-10-CM

## 2012-09-06 DIAGNOSIS — Z9012 Acquired absence of left breast and nipple: Secondary | ICD-10-CM

## 2013-01-23 ENCOUNTER — Ambulatory Visit (HOSPITAL_BASED_OUTPATIENT_CLINIC_OR_DEPARTMENT_OTHER): Payer: BC Managed Care – PPO | Admitting: Hematology & Oncology

## 2013-01-23 ENCOUNTER — Other Ambulatory Visit (HOSPITAL_BASED_OUTPATIENT_CLINIC_OR_DEPARTMENT_OTHER): Payer: BC Managed Care – PPO | Admitting: Lab

## 2013-01-23 VITALS — BP 119/61 | HR 86 | Temp 98.9°F | Resp 16 | Ht 64.0 in | Wt 178.0 lb

## 2013-01-23 DIAGNOSIS — C50919 Malignant neoplasm of unspecified site of unspecified female breast: Secondary | ICD-10-CM

## 2013-01-23 DIAGNOSIS — M810 Age-related osteoporosis without current pathological fracture: Secondary | ICD-10-CM

## 2013-01-23 DIAGNOSIS — C50912 Malignant neoplasm of unspecified site of left female breast: Secondary | ICD-10-CM

## 2013-01-23 LAB — CBC WITH DIFFERENTIAL (CANCER CENTER ONLY)
BASO#: 0 10*3/uL (ref 0.0–0.2)
BASO%: 0.6 % (ref 0.0–2.0)
Eosinophils Absolute: 0.1 10*3/uL (ref 0.0–0.5)
HCT: 37.7 % (ref 34.8–46.6)
HGB: 12.7 g/dL (ref 11.6–15.9)
LYMPH#: 1.9 10*3/uL (ref 0.9–3.3)
MONO#: 0.3 10*3/uL (ref 0.1–0.9)
NEUT%: 52.6 % (ref 39.6–80.0)
RBC: 4.2 10*6/uL (ref 3.70–5.32)
WBC: 4.8 10*3/uL (ref 3.9–10.0)

## 2013-01-23 NOTE — Progress Notes (Signed)
This office note has been dictated.

## 2013-01-24 LAB — COMPREHENSIVE METABOLIC PANEL
ALT: 27 U/L (ref 0–35)
BUN: 10 mg/dL (ref 6–23)
CO2: 25 mEq/L (ref 19–32)
Calcium: 9.4 mg/dL (ref 8.4–10.5)
Chloride: 106 mEq/L (ref 96–112)
Creatinine, Ser: 0.82 mg/dL (ref 0.50–1.10)
Glucose, Bld: 248 mg/dL — ABNORMAL HIGH (ref 70–99)

## 2013-01-24 LAB — LACTATE DEHYDROGENASE: LDH: 138 U/L (ref 94–250)

## 2013-01-24 NOTE — Progress Notes (Signed)
CC:   Windle Guard, M.D.  DIAGNOSIS:  Locally-recurrent ductal carcinoma of the left breast.  CURRENT THERAPY:  Observation.  INTERIM HISTORY:  Ms. Hannay comes in for followup.  We see her every 6 months.  She is doing well.  She has had no problems since we last saw her.  She has been traveling quite a bit.  She has been enjoying all this.  She did have a mammogram in March.  The mammogram looked good with no evidence of suspicious microcalcifications.  There has been no change in bowel or bladder habits.  She has had no problems with leg swelling.  She has had no rashes.  When we last saw her in January, her vitamin D was 49.  PHYSICAL EXAMINATION:  General:  This is a well-developed, well- nourished African American female in no obvious distress.  Vital signs: Temperature of 98.9, pulse 86, respiratory rate 16, blood pressure 119/61.  Weight is 178.  Head and neck:  Normocephalic, atraumatic skull.  There are no ocular or oral lesions.  There are no palpable cervical or supraclavicular lymph nodes.  Lungs:  Clear bilaterally. Cardiac:  Regular rate and rhythm with a normal S1 and S2.  There are no murmurs, rubs or bruits.  Breasts:  Right breast with no masses, edema or erythema.  There is no right axillary adenopathy.  Left breast is reconstructed.  She had a mastectomy.  The reconstruction site is well healed.  No masses are noted.  There is no surrounding erythema or nodularity.  There is no left axillary adenopathy.  Abdomen:  Soft.  She has good bowel sounds.  There is no fluid wave.  There is no palpable hepatosplenomegaly.  Back:  Shows no tenderness over the spine, ribs, or hips.  Extremities:  Show no clubbing, cyanosis or edema.  Specifically, there is no lymphedema in the left arm.  Neurological:  Shows no focal neurological deficits.  LABORATORY STUDIES:  White cell count 4.8, hemoglobin 12.7, hematocrit 37.7, platelet count 255,000.  IMPRESSION:  Ms. Schnyder  is a very charming 62 year old African American female with history of locally-recurrent ductal carcinoma of the left breast.  She underwent chemotherapy for this.  She is doing quite well. There is no evidence of recurrence today.  It has now been about 8 years.  We will plan for another 51-month followup.  I think once we get to 10 years, then we can move her to 1-year followup.   ______________________________ Josph Macho, M.D. PRE/MEDQ  D:  01/23/2013  T:  01/24/2013  Job:  [id number]

## 2013-07-26 ENCOUNTER — Ambulatory Visit (HOSPITAL_BASED_OUTPATIENT_CLINIC_OR_DEPARTMENT_OTHER): Payer: No Typology Code available for payment source | Admitting: Hematology & Oncology

## 2013-07-26 ENCOUNTER — Other Ambulatory Visit (HOSPITAL_BASED_OUTPATIENT_CLINIC_OR_DEPARTMENT_OTHER): Payer: No Typology Code available for payment source | Admitting: Lab

## 2013-07-26 ENCOUNTER — Encounter: Payer: Self-pay | Admitting: Hematology & Oncology

## 2013-07-26 VITALS — BP 133/62 | HR 78 | Temp 98.0°F | Resp 14 | Ht 64.0 in | Wt 176.0 lb

## 2013-07-26 DIAGNOSIS — C50919 Malignant neoplasm of unspecified site of unspecified female breast: Secondary | ICD-10-CM

## 2013-07-26 DIAGNOSIS — Z87898 Personal history of other specified conditions: Secondary | ICD-10-CM

## 2013-07-26 DIAGNOSIS — C50912 Malignant neoplasm of unspecified site of left female breast: Secondary | ICD-10-CM

## 2013-07-26 LAB — CBC WITH DIFFERENTIAL (CANCER CENTER ONLY)
BASO#: 0.1 10*3/uL (ref 0.0–0.2)
BASO%: 1.2 % (ref 0.0–2.0)
EOS%: 2.1 % (ref 0.0–7.0)
Eosinophils Absolute: 0.1 10*3/uL (ref 0.0–0.5)
HEMATOCRIT: 38 % (ref 34.8–46.6)
HEMOGLOBIN: 12.6 g/dL (ref 11.6–15.9)
LYMPH#: 1.8 10*3/uL (ref 0.9–3.3)
LYMPH%: 41.7 % (ref 14.0–48.0)
MCH: 29.9 pg (ref 26.0–34.0)
MCHC: 33.2 g/dL (ref 32.0–36.0)
MCV: 90 fL (ref 81–101)
MONO#: 0.3 10*3/uL (ref 0.1–0.9)
MONO%: 6.1 % (ref 0.0–13.0)
NEUT#: 2.1 10*3/uL (ref 1.5–6.5)
NEUT%: 48.9 % (ref 39.6–80.0)
Platelets: 231 10*3/uL (ref 145–400)
RBC: 4.22 10*6/uL (ref 3.70–5.32)
RDW: 13.4 % (ref 11.1–15.7)
WBC: 4.3 10*3/uL (ref 3.9–10.0)

## 2013-07-26 LAB — COMPREHENSIVE METABOLIC PANEL
ALT: 37 U/L — ABNORMAL HIGH (ref 0–35)
AST: 23 U/L (ref 0–37)
Albumin: 4.1 g/dL (ref 3.5–5.2)
Alkaline Phosphatase: 98 U/L (ref 39–117)
BILIRUBIN TOTAL: 0.4 mg/dL (ref 0.2–1.2)
BUN: 10 mg/dL (ref 6–23)
CALCIUM: 9 mg/dL (ref 8.4–10.5)
CHLORIDE: 100 meq/L (ref 96–112)
CO2: 27 meq/L (ref 19–32)
CREATININE: 0.82 mg/dL (ref 0.50–1.10)
Glucose, Bld: 250 mg/dL — ABNORMAL HIGH (ref 70–99)
Potassium: 3.9 mEq/L (ref 3.5–5.3)
Sodium: 135 mEq/L (ref 135–145)
Total Protein: 6.8 g/dL (ref 6.0–8.3)

## 2013-07-26 NOTE — Progress Notes (Signed)
This office note has been dictated.

## 2013-07-29 NOTE — Progress Notes (Signed)
CC:   Claris Gower, M.D.  DIAGNOSIS:  Locally recurrent infiltrating ductal carcinoma of the left breast.  CURRENT THERAPY:  Observation.  INTERIM HISTORY:  Ms. Turkington comes in for followup.  We see her every 6 months.  Since we last saw her, she has been doing quite well.  She has really had no problems since we last saw her.  She has had no fatigue or weakness.  There is no cough or shortness of breath.  She has had a good appetite.  There is no change in bowel or bladder habits.  She has not noticed any leg swelling.  She has had no rashes.  There has been no change in her medications.  She is diabetic and on Glucophage.  PHYSICAL EXAMINATION:  General:  This is a well-developed, well- nourished African American female in no obvious distress.  Vital Signs: Temperature of 98, pulse 78, respiratory rate 14, blood pressure 133/62, weight is 176 pounds.  Head and Neck:  Normocephalic, atraumatic skull. There are no ocular or oral lesions.  There are no palpable cervical or supraclavicular lymph nodes.  Lungs:  Clear bilaterally.  Cardiac: Regular rate and rhythm with normal S1, S2.  There are no murmurs, rubs, or bruits.  Abdomen:  Soft.  She has good bowel sounds.  There is no fluid wave.  There is no palpable hepatosplenomegaly.  Breasts:  Right breast with no masses, edema, or erythema.  There is no right axillary adenopathy.  Left chest wall shows well-healed mastectomy.  She has some radiation therapy changes on the left anterior chest wall.  No nodules are noted on the left anterior chest wall.  There is no left axillary adenopathy.  Extremities:  No clubbing, cyanosis, or edema.  There may be some slight lymphedema of the left arm.  She has good range of motion of her joints.  Neurologic:  No focal neurological deficits.  LABORATORY STUDIES:  White cell count is 4.3, hemoglobin 12.6, hematocrit 38, platelet count 231.  MCV is 90.  Her blood sugar is 250.  IMPRESSION:   Ms. Wayment is a nice 63 year old African American female. She has history of locally recurrent ductal carcinoma of left breast. She underwent mastectomy.  She had chemotherapy for this.  So far, I see no evidence of recurrence.  This now been, I think, over 8 years since she had the episode.  For now, we will plan to get her back in another 6 months.  I do not see that we need to do any scans on her in between visits.    ______________________________ Volanda Napoleon, M.D. PRE/MEDQ  D:  07/28/2013  T:  07/29/2013  Job:  [id number]

## 2013-09-12 ENCOUNTER — Other Ambulatory Visit: Payer: Self-pay

## 2013-09-12 ENCOUNTER — Ambulatory Visit
Admission: RE | Admit: 2013-09-12 | Discharge: 2013-09-12 | Disposition: A | Discharge status: Home / Self Care | Payer: No Typology Code available for payment source | Source: Ambulatory Visit

## 2013-09-12 DIAGNOSIS — Z1231 Encounter for screening mammogram for malignant neoplasm of breast: Secondary | ICD-10-CM

## 2013-09-12 DIAGNOSIS — Z9012 Acquired absence of left breast and nipple: Secondary | ICD-10-CM

## 2013-10-22 ENCOUNTER — Encounter (HOSPITAL_COMMUNITY): Payer: Self-pay | Admitting: Emergency Medicine

## 2013-10-22 ENCOUNTER — Emergency Department (HOSPITAL_COMMUNITY): Payer: No Typology Code available for payment source

## 2013-10-22 ENCOUNTER — Emergency Department (HOSPITAL_COMMUNITY)
Admission: EM | Admit: 2013-10-22 | Discharge: 2013-10-22 | Disposition: A | Discharge status: Home / Self Care | Payer: No Typology Code available for payment source | Attending: Emergency Medicine | Admitting: Emergency Medicine

## 2013-10-22 ENCOUNTER — Emergency Department (HOSPITAL_COMMUNITY)
Admission: EM | Admit: 2013-10-22 | Discharge: 2013-10-22 | Disposition: A | Discharge status: Nursing Home (Includes ICF) | Payer: No Typology Code available for payment source | Source: Home / Self Care | Attending: Family Medicine | Admitting: Family Medicine

## 2013-10-22 DIAGNOSIS — R1031 Right lower quadrant pain: Secondary | ICD-10-CM

## 2013-10-22 DIAGNOSIS — Z79899 Other long term (current) drug therapy: Secondary | ICD-10-CM | POA: Insufficient documentation

## 2013-10-22 DIAGNOSIS — Z9221 Personal history of antineoplastic chemotherapy: Secondary | ICD-10-CM | POA: Insufficient documentation

## 2013-10-22 DIAGNOSIS — R110 Nausea: Secondary | ICD-10-CM | POA: Insufficient documentation

## 2013-10-22 DIAGNOSIS — R109 Unspecified abdominal pain: Secondary | ICD-10-CM

## 2013-10-22 DIAGNOSIS — Z7982 Long term (current) use of aspirin: Secondary | ICD-10-CM | POA: Insufficient documentation

## 2013-10-22 DIAGNOSIS — E278 Other specified disorders of adrenal gland: Secondary | ICD-10-CM | POA: Insufficient documentation

## 2013-10-22 DIAGNOSIS — E279 Disorder of adrenal gland, unspecified: Secondary | ICD-10-CM

## 2013-10-22 DIAGNOSIS — Z853 Personal history of malignant neoplasm of breast: Secondary | ICD-10-CM | POA: Insufficient documentation

## 2013-10-22 LAB — COMPREHENSIVE METABOLIC PANEL
ALT: 32 U/L (ref 0–35)
AST: 22 U/L (ref 0–37)
Albumin: 4.2 g/dL (ref 3.5–5.2)
Alkaline Phosphatase: 99 U/L (ref 39–117)
BILIRUBIN TOTAL: 0.4 mg/dL (ref 0.3–1.2)
BUN: 9 mg/dL (ref 6–23)
CHLORIDE: 98 meq/L (ref 96–112)
CO2: 25 mEq/L (ref 19–32)
CREATININE: 0.74 mg/dL (ref 0.50–1.10)
Calcium: 9.8 mg/dL (ref 8.4–10.5)
GFR calc non Af Amer: 89 mL/min — ABNORMAL LOW (ref >90)
GLUCOSE: 137 mg/dL — AB (ref 70–99)
Potassium: 4 mEq/L (ref 3.7–5.3)
Sodium: 139 mEq/L (ref 137–147)
Total Protein: 7.9 g/dL (ref 6.0–8.3)

## 2013-10-22 LAB — POCT URINALYSIS DIP (DEVICE)
BILIRUBIN URINE: NEGATIVE
GLUCOSE, UA: NEGATIVE mg/dL
HGB URINE DIPSTICK: NEGATIVE
KETONES UR: NEGATIVE mg/dL
Leukocytes, UA: NEGATIVE
Nitrite: NEGATIVE
Protein, ur: NEGATIVE mg/dL
SPECIFIC GRAVITY, URINE: 1.01 (ref 1.005–1.030)
UROBILINOGEN UA: 1 mg/dL (ref 0.0–1.0)
pH: 7 (ref 5.0–8.0)

## 2013-10-22 LAB — CBC WITH DIFFERENTIAL/PLATELET
Basophils Absolute: 0 10*3/uL (ref 0.0–0.1)
Basophils Relative: 1 % (ref 0–1)
EOS ABS: 0 10*3/uL (ref 0.0–0.7)
Eosinophils Relative: 1 % (ref 0–5)
HEMATOCRIT: 40.1 % (ref 36.0–46.0)
HEMOGLOBIN: 13.9 g/dL (ref 12.0–15.0)
LYMPHS ABS: 2.2 10*3/uL (ref 0.7–4.0)
Lymphocytes Relative: 37 % (ref 12–46)
MCH: 30.7 pg (ref 26.0–34.0)
MCHC: 34.7 g/dL (ref 30.0–36.0)
MCV: 88.5 fL (ref 78.0–100.0)
MONO ABS: 0.4 10*3/uL (ref 0.1–1.0)
MONOS PCT: 7 % (ref 3–12)
NEUTROS PCT: 54 % (ref 43–77)
Neutro Abs: 3.3 10*3/uL (ref 1.7–7.7)
Platelets: 267 10*3/uL (ref 150–400)
RBC: 4.53 MIL/uL (ref 3.87–5.11)
RDW: 13.6 % (ref 11.5–15.5)
WBC: 6.1 10*3/uL (ref 4.0–10.5)

## 2013-10-22 LAB — URINALYSIS, ROUTINE W REFLEX MICROSCOPIC
Bilirubin Urine: NEGATIVE
Glucose, UA: NEGATIVE mg/dL
Hgb urine dipstick: NEGATIVE
KETONES UR: NEGATIVE mg/dL
Leukocytes, UA: NEGATIVE
Nitrite: NEGATIVE
Protein, ur: NEGATIVE mg/dL
Specific Gravity, Urine: 1.011 (ref 1.005–1.030)
UROBILINOGEN UA: 1 mg/dL (ref 0.0–1.0)
pH: 6.5 (ref 5.0–8.0)

## 2013-10-22 LAB — LIPASE, BLOOD: Lipase: 19 U/L (ref 11–59)

## 2013-10-22 MED ORDER — IOHEXOL 300 MG/ML  SOLN
100.0000 mL | Freq: Once | INTRAMUSCULAR | Status: AC | PRN
  Administered 2013-10-22: 100 mL via INTRAVENOUS

## 2013-10-22 MED ORDER — ONDANSETRON HCL 4 MG/2ML IJ SOLN
4.0000 mg | Freq: Once | INTRAMUSCULAR | Status: AC
  Administered 2013-10-22: 4 mg via INTRAVENOUS
  Filled 2013-10-22: qty 2

## 2013-10-22 MED ORDER — SODIUM CHLORIDE 0.9 % IV BOLUS (SEPSIS)
1000.0000 mL | Freq: Once | INTRAVENOUS | Status: DC
Start: 2013-10-22 — End: 2013-10-22

## 2013-10-22 MED ORDER — SODIUM CHLORIDE 0.9 % IV BOLUS (SEPSIS)
500.0000 mL | Freq: Once | INTRAVENOUS | Status: AC
  Administered 2013-10-22: 500 mL via INTRAVENOUS

## 2013-10-22 MED ORDER — ONDANSETRON HCL 4 MG PO TABS: 4.0000 mg | ORAL_TABLET | Freq: Four times a day (QID) | ORAL | Status: DC

## 2013-10-22 NOTE — Discharge Instructions (Signed)
Please call your doctor for a followup appointment within 24-48 hours. When you talk to your doctor please let them know that you were seen in the emergency department and have them acquire all of your records so that they can discuss the findings with you and formulate a treatment plan to fully care for your new and ongoing problems. Please call and set-up an appointment with your primary care provider to be re-assessed within the next 24-48 hours Please call and set-up an appointment with your oncologist regarding the nodule found on your right adrenal gland Will need an MRI to be performed as an outpatient and possible biopsy to rule out cancerous findings since history of cancer is noted Please take antinausea medications as needed Please rest and stay hydrated Please drink plenty of fluids Please continue to monitor symptoms closely and if symptoms are to worsen or change (fever greater than 101, chills, chest pain, shortness of breath, difficulty breathing, numbness, tingling, blood in the stools, black tarry stools, dizziness, weakness, inability to keep food or fluids down, changes or worsen to abdominal pain) please report back to the ED immediately   Abdominal Pain, Women Abdominal (stomach, pelvic, or belly) pain can be caused by many things. It is important to tell your doctor:  The location of the pain.  Does it come and go or is it present all the time?  Are there things that start the pain (eating certain foods, exercise)?  Are there other symptoms associated with the pain (fever, nausea, vomiting, diarrhea)? All of this is helpful to know when trying to find the cause of the pain. CAUSES   Stomach: virus or bacteria infection, or ulcer.  Intestine: appendicitis (inflamed appendix), regional ileitis (Crohn's disease), ulcerative colitis (inflamed colon), irritable bowel syndrome, diverticulitis (inflamed diverticulum of the colon), or cancer of the stomach or  intestine.  Gallbladder disease or stones in the gallbladder.  Kidney disease, kidney stones, or infection.  Pancreas infection or cancer.  Fibromyalgia (pain disorder).  Diseases of the female organs:  Uterus: fibroid (non-cancerous) tumors or infection.  Fallopian tubes: infection or tubal pregnancy.  Ovary: cysts or tumors.  Pelvic adhesions (scar tissue).  Endometriosis (uterus lining tissue growing in the pelvis and on the pelvic organs).  Pelvic congestion syndrome (female organs filling up with blood just before the menstrual period).  Pain with the menstrual period.  Pain with ovulation (producing an egg).  Pain with an IUD (intrauterine device, birth control) in the uterus.  Cancer of the female organs.  Functional pain (pain not caused by a disease, may improve without treatment).  Psychological pain.  Depression. DIAGNOSIS  Your doctor will decide the seriousness of your pain by doing an examination.  Blood tests.  X-rays.  Ultrasound.  CT scan (computed tomography, special type of X-ray).  MRI (magnetic resonance imaging).  Cultures, for infection.  Barium enema (dye inserted in the large intestine, to better view it with X-rays).  Colonoscopy (looking in intestine with a lighted tube).  Laparoscopy (minor surgery, looking in abdomen with a lighted tube).  Major abdominal exploratory surgery (looking in abdomen with a large incision). TREATMENT  The treatment will depend on the cause of the pain.   Many cases can be observed and treated at home.  Over-the-counter medicines recommended by your caregiver.  Prescription medicine.  Antibiotics, for infection.  Birth control pills, for painful periods or for ovulation pain.  Hormone treatment, for endometriosis.  Nerve blocking injections.  Physical therapy.  Antidepressants.  Counseling with a psychologist or psychiatrist.  Minor or major surgery. HOME CARE INSTRUCTIONS   Do  not take laxatives, unless directed by your caregiver.  Take over-the-counter pain medicine only if ordered by your caregiver. Do not take aspirin because it can cause an upset stomach or bleeding.  Try a clear liquid diet (broth or water) as ordered by your caregiver. Slowly move to a bland diet, as tolerated, if the pain is related to the stomach or intestine.  Have a thermometer and take your temperature several times a day, and record it.  Bed rest and sleep, if it helps the pain.  Avoid sexual intercourse, if it causes pain.  Avoid stressful situations.  Keep your follow-up appointments and tests, as your caregiver orders.  If the pain does not go away with medicine or surgery, you may try:  Acupuncture.  Relaxation exercises (yoga, meditation).  Group therapy.  Counseling. SEEK MEDICAL CARE IF:   You notice certain foods cause stomach pain.  Your home care treatment is not helping your pain.  You need stronger pain medicine.  You want your IUD removed.  You feel faint or lightheaded.  You develop nausea and vomiting.  You develop a rash.  You are having side effects or an allergy to your medicine. SEEK IMMEDIATE MEDICAL CARE IF:   Your pain does not go away or gets worse.  You have a fever.  Your pain is felt only in portions of the abdomen. The right side could possibly be appendicitis. The left lower portion of the abdomen could be colitis or diverticulitis.  You are passing blood in your stools (bright red or black tarry stools, with or without vomiting).  You have blood in your urine.  You develop chills, with or without a fever.  You pass out. MAKE SURE YOU:   Understand these instructions.  Will watch your condition.  Will get help right away if you are not doing well or get worse. Document Released: 04/12/2007 Document Revised: 09/07/2011 Document Reviewed: 05/02/2009 [facility] Patient Information 2014 [place], [place].   Emergency  Department Resource Guide 1) Find a Doctor and Pay Out of Pocket Although you won't have to find out who is covered by your insurance plan, it is a good idea to ask around and get recommendations. You will then need to call the office and see if the doctor you have chosen will accept you as a new patient and what types of options they offer for patients who are self-pay. Some doctors offer discounts or will set up payment plans for their patients who do not have insurance, but you will need to ask so you aren't surprised when you get to your appointment.  2) Contact Your Local Health Department Not all health departments have doctors that can see patients for sick visits, but many do, so it is worth a call to see if yours does. If you don't know where your local health department is, you can check in your phone book. The CDC also has a tool to help you locate your state's health department, and many state websites also have listings of all of their local health departments.  3) Find a [facility] If your illness is not likely to be very severe or complicated, you may want to try a walk in clinic. These are popping up all over the country in pharmacies, drugstores, and shopping centers. They're usually staffed by nurse practitioners or physician assistants that have been trained to treat common illnesses  and complaints. They're usually fairly quick and inexpensive. However, if you have serious medical issues or chronic medical problems, these are probably not your best option.  No Primary Care Doctor: - Call Health Connect at  [phone] - they can help you locate a primary care doctor that  accepts your insurance, provides certain services, etc. - Physician Referral Service- [phone]  Chronic Pain Problems: Organization         Address  Phone   Notes  [facility]  [phone] Patients need to be referred by their primary care doctor.   Medication  Assistance: Organization         Address  Phone   Notes  [facility] Medication [facility] [address] [place], [place] [address] [phone] --Must be a resident of [facility] -- Must have NO insurance coverage whatsoever (no Medicaid/ Medicare, etc.) -- The pt. MUST have a primary care doctor that directs their care regularly and follows them in the community   [facility]ist  [phone]   [employer]  [phone]    Agencies that provide inexpensive medical care: Organization         Address  Phone   Notes  [place]  [phone]   [facility]    [phone]   [facility] [place], [place] [address] [phone]   [place] [address] [place] [phone]   Planned Parenthood    [phone]   [facility]    [phone]   [place] and [place] [address], [place] Phone:  [phone], Fax:  [phone] Hours of Operation:  9 am - 6 pm, M-F.  Also accepts Medicaid/Medicare and self-pay.  [facility] [place] [address], [address], [place] Phone: [phone], Fax: [phone]. Hours of Operation:  8:30 am - 5:30 pm, M-F.  Also accepts Medicaid and self-pay.  [facility] [place] [address] [place] Phone: [phone]   [place], [place], [place] [phone], Ext. [phone] Mondays & Thursdays: 7-9 AM.  First 15 patients are seen on a first come, first serve basis.    [place] Providers:  Organization         Address  Phone   Notes  [facility] [address], [place] [phone] Also accepts self-pay patients.  [facility] [id number] [place], [place]  [phone]   [place], [address], [place]  [phone]   [facility] [address] [place] [phone]   Lucianne Lei [address], [place]   [phone] Only accepts [place] Access [place] patients after they have their name applied to their card.   Self-Pay (no insurance) in [facility]:  Organization         Address  Phone   Notes  Sickle Cell Patients, [facility] [place] [phone]   [facility] [place] [phone]   [facility] [place]  [address],  [phone]   [facility]/Dr. Osei-Bonsu  [address] [place] or [address], [place] [phone] Phone number for both [facility] and [place] locations is the same.  Urgent Medical and [facility] [address], [place] [phone]   [facility] [address] [place] or [address] [phone]   [facility] [address], [place] [phone], phone; [phone], fax Sees patients 1st and 3rd Saturday of every month.  Must not qualify for public or private insurance (i.e. Medicaid, Medicare, [place] Health Choice, Veterans' Benefits)  Household income should be no more than 200% of the poverty level The clinic cannot treat you if you are pregnant or think you are pregnant  Sexually transmitted diseases are not treated at the clinic.    Dental Care: Organization         Address  Phone  Notes  [facility] Department of [facility] [place] [phone] Accepts children up to age 38 who are enrolled in [place] or [place]; pregnant women with a Medicaid card; and children who have applied for Medicaid or [place] Health Choice, but were declined, whose parents can pay a reduced fee at time of service.  [facility]  Department of [facility]  [address], [place] [phone] Accepts children up to age 68 who are enrolled in [place] or [place]; pregnant women with a Medicaid card; and children who have applied for Medicaid or [place] Health Choice, but were declined, whose parents can pay a reduced fee at time of service.  [place] Adult Dental Access PROGRAM  [place] [phone] Patients are seen by appointment only. Walk-ins are not accepted. [place] will see patients 63 years of age and older. Monday - Tuesday (8am-5pm) Most Wednesdays (8:30-5pm) $30 per visit, cash only  [facility] Adult Dental Access PROGRAM  [address], [facility] [phone] Patients are seen by appointment only. Walk-ins are not accepted. [place] will see patients 75 years of age and older. One Wednesday Evening (Monthly: Volunteer Based).  $30 per visit, cash only  [place]  [phone] for adults; Children under age 33, call Graduate Pediatric Dentistry at [phone]. Children aged 15-14, please call [phone] to request a pediatric application.  Dental services are provided in all areas of dental care including fillings, crowns and bridges, complete and partial dentures, implants, gum treatment, root canals, and extractions. Preventive care is also provided. Treatment is provided to both adults and children. Patients are selected via a lottery and there is often a waiting list.   [facility] [address], [place]  [phone] [email or web address]   [facility] [address] [place], [place] [phone], Ext. [phone] Second and Fourth Thursday of each month, opens at 6:30 AM; Clinic ends at 9 AM.  Patients are seen on a first-come first-served basis, and a limited number are seen during each clinic.   [facility]  [address] Hillard Danker [place], [place] [phone]    Eligibility Requirements You must have lived in [place], [place], or [place] counties for at least the last three months.   You cannot be eligible for state or federal sponsored [place] [employer], including [employer], [place], or [employer].   You generally cannot be eligible for healthcare insurance through your employer.    How to apply: Eligibility screenings are held every Tuesday and Wednesday  afternoon from 1:00 pm until 4:00 pm. You do not need an appointment for the interview!  [facility] [address] [place], [place]   [place]  [place] Department  [place]  [phone]    Behavioral Health Resources in the Community: Intensive Outpatient Programs Organization         Address  Phone  Notes  [place]. [address] [place], [place] [phone]   [facility] [address], [place], [place]   ADS: Alcohol [facility] Drug [facility] [address] [place], [place]   [place] [address],  [place], [place] or [phone]   Substance Abuse Resources Organization         Address  Phone  Notes  Alcohol and Drug Services  [phone]   [place]  [phone]   [facility] [place]   Chinita Pester  [phone]   Residential & Outpatient Substance Abuse Program  [phone]   Psychological Services Organization         Address  Phone  Notes  [facility] [place]  [place]  [phone]   [place] [address], [place] or [phone]    Mobile Crisis Teams Organization         Address  Phone  Notes  [facility], Mobile Crisis Care Unit  [phone]   [facility]  [address]  [place], [place]   [place] [address] [place] [phone]    Self-Help/Support Groups Organization         Address  Phone             Notes  [place]. of [place] - variety of support groups  [place] Call for more information  Narcotics Anonymous (NA), [facility] [address], [employer] [place]  2 meetings at this location   Special educational needs teacher         Address  Phone  Notes  [facility]idential Treatment [place],    [place]  [phone]   [facility]  [address], [place] [id number], [place], [place]   [place], [place] [phone] Admissions: 8am-3pm M-F  Incentives Substance [place] [address],    [place], [place] [phone]   [facility] [address] [place], [place], [place]   [facility] [address],  [place], [place]   [facility] Programs - Intensive Outpatient [address] Kristeen [address], [place], [place]   [facility] ([place].) [place].,  [place], [place] [phone] or [phone]   [facility] (RTS) [address], [place], [place] Accepts Medicaid  [facility] [place] [address],  [place] [phone] Substance Abuse/Addiction Treatment   [facility] Organization         Address  Phone  Notes  [facility]  [phone]   Domenic Schwab, PhD [address] Arlis [place], [place]   [phone] or [phone]   [place], [place] [phone]   [place] [address] [place], [place] [phone] Insurance/Medicaid/sponsorship through [employer] and Families  [place], [place] [phone] [place], [place] [phone]    Dr. Adele Schilder  [phone]   [facility] [place] Dept. 1) [address] [place] 2) [place] 3)  [place] [address], [place] [phone]  [phone]   [place] [phone] or [phone] (After Hours)

## 2013-10-22 NOTE — ED Notes (Signed)
Pt sent from [facility] for further evaluation of nausea and RLQ abd pain since friday

## 2013-10-22 NOTE — ED Provider Notes (Signed)
[name] [name] is a 63 y.o. female who presents to Urgent Care today for right lower quadrant abdominal pain. Patient has had 2 days of worsening right lower quadrant abdominal pain. The pain is associated with nausea. No fevers chills vomiting or diarrhea. Patient has tried Aleve which is not helpful. She denies any urinary symptoms. Pain is worse with activity.  Patient denies any history of appendectomy  Past Medical History  Diagnosis Date  . Recurrent infiltrating ductal carcinoma of breast 06/18/2011   History  Substance Use Topics  . Smoking status: Never Smoker   . Smokeless tobacco: Never Used     Comment: never used product  . Alcohol Use: No   ROS as above Medications: No current facility-administered medications for this encounter.   Current Outpatient Prescriptions  Medication Sig Dispense Refill  . aspirin 81 MG tablet Take 81 mg by mouth daily.      . cholecalciferol (VITAMIN D) 1000 UNITS tablet Take 1,000 Units by mouth daily. Takes 2,000 units daily      . metFORMIN (GLUCOPHAGE-XR) 500 MG 24 hr tablet Take 500 mg by mouth daily with breakfast.        Exam:  BP 135/79  Pulse 85  Temp(Src) 98 F (36.7 C) (Oral)  Resp 15  SpO2 100% Gen: Well NAD HEENT: EOMI,  MMM Lungs: Normal work of breathing. CTABL Heart: RRR no MRG Abd: NABS, tender palpation right lower quadrant with rebound and guarding. Positive psoas sign. No CV angle tenderness to percussion Exts: Brisk capillary refill, warm and well perfused.   Results for orders placed during the hospital encounter of 10/22/13 (from the past 24 hour(s))  POCT URINALYSIS DIP (DEVICE)     Status: None   Collection Time    10/22/13  1:39 PM      Result Value Ref Range   Glucose, UA NEGATIVE  NEGATIVE mg/dL   Bilirubin Urine NEGATIVE  NEGATIVE   Ketones, ur NEGATIVE  NEGATIVE mg/dL   Specific Gravity, Urine 1.010  1.005 - 1.030   Hgb urine dipstick NEGATIVE  NEGATIVE   pH 7.0  5.0 - 8.0   Protein, ur  NEGATIVE  NEGATIVE mg/dL   Urobilinogen, UA 1.0  0.0 - 1.0 mg/dL   Nitrite NEGATIVE  NEGATIVE   Leukocytes, UA NEGATIVE  NEGATIVE   No results found.  Assessment and Plan: 63 y.o. female with right lower quadrant abdominal pain concerning for appendicitis. Plan to transfer patient to the emergency room for evaluation and management.  Discussed warning signs or symptoms. Please see discharge instructions. Patient expresses understanding.    Gregor Hams, MD 10/22/13 [phone]

## 2013-10-22 NOTE — ED Notes (Signed)
Tolerated gingerale without vomiting, but is feeling nauseated again.

## 2013-10-22 NOTE — ED Notes (Signed)
Patient transported to CT 

## 2013-10-22 NOTE — ED Notes (Signed)
Patient states has been having lower right quadrant pain That started this past Friday She thinks she still has her appendix

## 2013-10-22 NOTE — ED Provider Notes (Signed)
CSN: [id number]     Arrival date & time 10/22/13  1425 History   First MD Initiated Contact with Patient 10/22/13 1612     Chief Complaint  Patient presents with  . Abdominal Pain     (Consider location/radiation/quality/duration/timing/severity/associated sxs/prior Treatment) The history is provided by the patient. No language interpreter was used.  [name] [name] is a 63 year old female with past medical history of infiltrating ductal carcinoma of the breast diagnosed in 1999 with 10 months of chemotherapy followed by left breast mastectomy performed in 2006 with recent negative mammograms followed by [place] presenting to the ED with right lower quadrant abdominal pain that started on Friday. Patient reports that the abdominal pain is gone progressively worse described as a sharp shooting pain with motion and a gnawing nagging pain with lying down. Stated that the abdominal pain radiates across the bottom of her abdomen to the right lower quadrant and towards her right hip. Reported that she's been using Aleve with minimal relief. Stated that she's been having feelings of nausea without episodes of emesis. Denied fall, injuries, fever, chills, vomiting, diarrhea, melena, hematochezia, urinary symptoms, hematuria, dizziness, chest pain shortness breath, difficulty breathing. PCP/oncologist Dr. Arelia Sneddon  Past Medical History  Diagnosis Date  . Recurrent infiltrating ductal carcinoma of breast 06/18/2011   Past Surgical History  Procedure Laterality Date  . Mastectomy     History reviewed. No pertinent family history. History  Substance Use Topics  . Smoking status: Never Smoker   . Smokeless tobacco: Never Used     Comment: never used product  . Alcohol Use: No   OB History   Grav Para Term Preterm Abortions TAB SAB Ect Mult Living                 Review of Systems  Constitutional: Negative for fever and chills.  Respiratory: Negative for chest tightness and shortness of  breath.   Cardiovascular: Negative for chest pain.  Gastrointestinal: Positive for nausea and abdominal pain. Negative for vomiting, diarrhea, constipation, blood in stool and anal bleeding.  Genitourinary: Negative for dysuria, hematuria, decreased urine volume and pelvic pain.  Neurological: Negative for dizziness and weakness.  All other systems reviewed and are negative.     Allergies  Review of patient's allergies indicates no known allergies.  Home Medications   Prior to Admission medications   Medication Sig Start Date End Date Taking? Authorizing Provider  aspirin 81 MG tablet Take 81 mg by mouth daily.    Historical Provider, MD  cholecalciferol (VITAMIN D) 1000 UNITS tablet Take 1,000 Units by mouth daily. Takes 2,000 units daily    Historical Provider, MD  metFORMIN (GLUCOPHAGE-XR) 500 MG 24 hr tablet Take 500 mg by mouth daily with breakfast.    Historical Provider, MD   BP 141/63  Pulse 73  Temp(Src) 98.1 F (36.7 C) (Oral)  Resp 18  Ht 5\' 4"  (1.626 m)  Wt 176 lb 6.4 oz (80.015 kg)  BMI 30.26 kg/m2  SpO2 99% Physical Exam  Nursing note and vitals reviewed. Constitutional: She is oriented to person, place, and time. She appears well-developed and well-nourished. No distress.  HENT:  Head: Normocephalic and atraumatic.  Mouth/Throat: Oropharynx is clear and moist. No oropharyngeal exudate.  Eyes: Conjunctivae and EOM are normal. Pupils are equal, round, and reactive to light. Right eye exhibits no discharge. Left eye exhibits no discharge.  Neck: Normal range of motion. Neck supple.  Cardiovascular: Normal rate, regular rhythm and normal heart sounds.  Exam reveals no friction rub.   No murmur heard. Pulses:      Radial pulses are 2+ on the right side, and 2+ on the left side.       Dorsalis pedis pulses are 2+ on the right side, and 2+ on the left side.  Pulmonary/Chest: Effort normal and breath sounds normal. No respiratory distress. She has no wheezes. She has  no rales.  Abdominal: Soft. Bowel sounds are normal. She exhibits no distension. There is tenderness. There is no rebound and no guarding.  Negative abdominal distention identified Discomfort upon palpation to the right lower quadrant with a positive psoas and obturator sign Negative rebound tenderness with palpation to the left lower quadrant  Musculoskeletal: Normal range of motion.  Neurological: She is alert and oriented to person, place, and time. No cranial nerve deficit. She exhibits normal muscle tone. Coordination normal.  Skin: Skin is warm and dry. No rash noted. She is not diaphoretic. No erythema.  Psychiatric: She has a normal mood and affect. Her behavior is normal. Thought content normal.    ED Course  Procedures (including critical care time)   Results for orders placed during the hospital encounter of 10/22/13  CBC WITH DIFFERENTIAL      Result Value Ref Range   WBC 6.1  4.0 - 10.5 K/uL   RBC 4.53  3.87 - 5.11 MIL/uL   Hemoglobin 13.9  12.0 - 15.0 g/dL   HCT 40.1  36.0 - 46.0 %   MCV 88.5  78.0 - 100.0 fL   MCH 30.7  26.0 - 34.0 pg   MCHC 34.7  30.0 - 36.0 g/dL   RDW 13.6  11.5 - 15.5 %   Platelets 267  150 - 400 K/uL   Neutrophils Relative % 54  43 - 77 %   Neutro Abs 3.3  1.7 - 7.7 K/uL   Lymphocytes Relative 37  12 - 46 %   Lymphs Abs 2.2  0.7 - 4.0 K/uL   Monocytes Relative 7  3 - 12 %   Monocytes Absolute 0.4  0.1 - 1.0 K/uL   Eosinophils Relative 1  0 - 5 %   Eosinophils Absolute 0.0  0.0 - 0.7 K/uL   Basophils Relative 1  0 - 1 %   Basophils Absolute 0.0  0.0 - 0.1 K/uL  COMPREHENSIVE METABOLIC PANEL      Result Value Ref Range   Sodium 139  137 - 147 mEq/L   Potassium 4.0  3.7 - 5.3 mEq/L   Chloride 98  96 - 112 mEq/L   CO2 25  19 - 32 mEq/L   Glucose, Bld 137 (*) 70 - 99 mg/dL   BUN 9  6 - 23 mg/dL   Creatinine, Ser 0.74  0.50 - 1.10 mg/dL   Calcium 9.8  8.4 - 10.5 mg/dL   Total Protein 7.9  6.0 - 8.3 g/dL   Albumin 4.2  3.5 - 5.2 g/dL   AST  22  0 - 37 U/L   ALT 32  0 - 35 U/L   Alkaline Phosphatase 99  39 - 117 U/L   Total Bilirubin 0.4  0.3 - 1.2 mg/dL   GFR calc non Af Amer 89 (*) >90 mL/min   GFR calc Af Amer >90  >90 mL/min  URINALYSIS, ROUTINE W REFLEX MICROSCOPIC      Result Value Ref Range   Color, Urine YELLOW  YELLOW   APPearance CLEAR  CLEAR   Specific Gravity, Urine  1.011  1.005 - 1.030   pH 6.5  5.0 - 8.0   Glucose, UA NEGATIVE  NEGATIVE mg/dL   Hgb urine dipstick NEGATIVE  NEGATIVE   Bilirubin Urine NEGATIVE  NEGATIVE   Ketones, ur NEGATIVE  NEGATIVE mg/dL   Protein, ur NEGATIVE  NEGATIVE mg/dL   Urobilinogen, UA 1.0  0.0 - 1.0 mg/dL   Nitrite NEGATIVE  NEGATIVE   Leukocytes, UA NEGATIVE  NEGATIVE  LIPASE, BLOOD      Result Value Ref Range   Lipase 19  11 - 59 U/L    Labs Review Labs Reviewed  COMPREHENSIVE METABOLIC PANEL - Abnormal; Notable for the following:    Glucose, Bld 137 (*)    GFR calc non Af Amer 89 (*)    All other components within normal limits  CBC WITH DIFFERENTIAL  URINALYSIS, ROUTINE W REFLEX MICROSCOPIC  LIPASE, BLOOD    Imaging Review Ct Abdomen Pelvis W Contrast  10/22/2013   CLINICAL DATA:  Right lower quadrant pain. Personal history of breast cancer.  EXAM: CT ABDOMEN AND PELVIS WITH CONTRAST  TECHNIQUE: Multidetector CT imaging of the abdomen and pelvis was performed using the standard protocol following bolus administration of intravenous contrast.  CONTRAST:  130mL OMNIPAQUE IOHEXOL 300 MG/ML  SOLN  COMPARISON:  CT ABD W/CM dated 09/29/2005; CT PELVIS W/CM dated 04/08/2004  FINDINGS: Bones:  No aggressive osseous lesions.  Lung Bases: Dependent atelectasis. Right middle lobe atelectasis or scarring. Left breast implant noted.  Liver: Macronodular cirrhosis which appears unchanged compared to prior exams. No focal mass lesion. Transient hepatic attenuation difference is present in the right hepatic lobe, likely secondary to altered flow associated with cirrhosis.  Spleen:   Nodular contour of the spleen.  Gallbladder:  Normal.  Common bile duct:  Normal.  Pancreas:  Normal.  Adrenal glands: There is a new indeterminate right adrenal nodule measuring 22 mm. This was not present on the prior examinations. The left adrenal gland appears normal. The right adrenal nodule is indeterminate based on washout characteristics.  Kidneys:  Normal enhancement and delayed excretion of contrast.  Stomach:  Normal.  Small bowel:  Normal.  Colon: Normal appendix. No inflammatory changes of colon or obstruction. Sigmoid diverticulitis. Old calcified epiploic appendagitis.  Pelvic Genitourinary:  Normal.  Vasculature: Atherosclerosis.  No acute vascular abnormality.  Body Wall: Surgical clips in the left lower abdominal wall.  IMPRESSION: 1. No acute abnormality. 2. Interval development of an indeterminate right adrenal nodule measuring 22 mm. Adrenal MRI recommended in this patient with a history of malignancy. Non-emergent MRI should be deferred until patient has been discharged for the acute illness, and can optimally cooperate with positioning and breath-holding instructions. 3. Macronodular cirrhosis.   Electronically Signed   By: Dereck Ligas M.D.   On: 10/22/2013 18:07     EKG Interpretation None      MDM   Final diagnoses:  Abdominal pain  Adrenal nodule   Medications  iohexol (OMNIPAQUE) 300 MG/ML solution 100 mL (100 mLs Intravenous Contrast Given 10/22/13 1742)  ondansetron (ZOFRAN) injection 4 mg (4 mg Intravenous Given 10/22/13 1958)  sodium chloride 0.9 % bolus 500 mL (500 mLs Intravenous New Bag/Given 10/22/13 1958)   Filed Vitals:   10/22/13 1700 10/22/13 1813 10/22/13 1908 10/22/13 2000  BP: 126/60 136/76 111/53 141/63  Pulse: 83 83 85 73  Temp:      TempSrc:      Resp:  18 18 18   Height:      Weight:  SpO2: 100% 100% 99% 99%    CBC negative elevated white blood cell count noted--negative left shift or leukocytosis. CMP negative findings-kidney and liver  functioning well. Lipase negative elevation. Urinalysis and negative for infection-negative nitrites and leukocytes noted. CT abdomen and pelvis negative for acute abnormalities-negative findings of inflammatory processes. A right adrenal nodule measuring approximately 22 cm identified-adrenal MRI recommended in this patient with a history of malignancy, nonemergent MRI should be deferred until patient has been discharged from the acute illness, outpatient MRI can be performed. Discussed CT imaging results with patient in great detail. Discussed with patient the need for an MRI to be performed as an outpatient and to followup with her oncologist regarding findings Negative findings of appendicitis. Negative findings of cholangitis/cholecystitis. Negative findings of pancreatitis. Negative acute abdominal processes noted-nonsurgical abdomen. Doubt UTI. Doubt pyelonephritis. Definitive etiology of abdominal pain unknown. Patient hydrated in ED setting with IV fluids and Zofran given for nausea. Nausea controlled in ED setting. Patient tolerated fluids by mouth well. Negative episodes of emesis while in ED setting. Patient stable, afebrile. Negative elevated white blood cell count-patient does not appear septic. Patient stable for discharge. Discharge patient with Zofran. Referred patient to primary care provider and oncologist. Discussed with patient at MRI as needed as an outpatient. Discussed with patient proper diet. Discussed with patient to closely monitor symptoms and if symptoms are to worsen or change to report back to the ED - strict return instructions given.  Patient agreed to plan of care, understood, all questions answered.   Jamse Mead, PA-C 10/23/13 1153

## 2013-10-22 NOTE — ED Notes (Signed)
Patient returned from CT

## 2013-10-23 ENCOUNTER — Telehealth: Payer: Self-pay | Admitting: Hematology & Oncology

## 2013-10-23 ENCOUNTER — Telehealth: Payer: Self-pay

## 2013-10-23 ENCOUNTER — Other Ambulatory Visit: Payer: Self-pay

## 2013-10-23 DIAGNOSIS — C50919 Malignant neoplasm of unspecified site of unspecified female breast: Secondary | ICD-10-CM

## 2013-10-23 DIAGNOSIS — R109 Unspecified abdominal pain: Secondary | ICD-10-CM

## 2013-10-23 NOTE — Telephone Encounter (Signed)
Pt aware of 4-30 MRI and to be NPO 4 hrs. I offered her 4-29 at [facility] but she declined

## 2013-10-23 NOTE — Telephone Encounter (Signed)
Received call from pt reporting she was seen in the ED last night for flank pain and difficulty walking and a mass was discovered. Reports she was advised to contact her oncologist about this.   After reviewing scans, Dr Marin Olp agrees that pt needs MRI. This information left on pt's personalized VM with instructions to contact our office for appt.  Orders placed. dph

## 2013-10-25 NOTE — ED Provider Notes (Signed)
  This was a shared visit with a mid-level provided (NP or PA).  Throughout the patient's course I was available for consultation/collaboration.  I saw the ECG (if appropriate), relevant labs and studies - I agree with the interpretation.  On my exam the patient was in no distress.  Patient's evaluation did not demonstrate acute life-threatening pathology, but there were several items that required f/u care.        Carmin Muskrat, MD 10/25/13 1530

## 2013-10-26 ENCOUNTER — Ambulatory Visit (HOSPITAL_COMMUNITY)
Admission: RE | Admit: 2013-10-26 | Discharge: 2013-10-26 | Disposition: A | Discharge status: Home / Self Care | Payer: No Typology Code available for payment source | Source: Ambulatory Visit | Attending: Hematology & Oncology | Admitting: Hematology & Oncology

## 2013-10-26 ENCOUNTER — Ambulatory Visit (HOSPITAL_COMMUNITY): Payer: No Typology Code available for payment source

## 2013-10-26 DIAGNOSIS — E279 Disorder of adrenal gland, unspecified: Secondary | ICD-10-CM | POA: Insufficient documentation

## 2013-10-26 DIAGNOSIS — C50919 Malignant neoplasm of unspecified site of unspecified female breast: Secondary | ICD-10-CM | POA: Insufficient documentation

## 2013-10-26 DIAGNOSIS — R109 Unspecified abdominal pain: Secondary | ICD-10-CM | POA: Insufficient documentation

## 2013-10-26 MED ORDER — GADOBENATE DIMEGLUMINE 529 MG/ML IV SOLN
15.0000 mL | Freq: Once | INTRAVENOUS | Status: AC | PRN
  Administered 2013-10-26: 15 mL via INTRAVENOUS

## 2013-10-30 ENCOUNTER — Telehealth: Payer: Self-pay | Admitting: *Deleted

## 2013-10-30 NOTE — Telephone Encounter (Addendum)
Message copied by Lenn Sink on Mon Oct 30, 2013  9:28 AM ------      Message from: Volanda Napoleon      Created: Sun Oct 29, 2013  8:31 PM       Call - MRI does NOT show any recurrent cancer!!!  Laurey Arrow ------Left voicemail informing pt that MRI doesn't show any recurrent cancer!

## 2013-11-03 ENCOUNTER — Telehealth: Payer: Self-pay | Admitting: *Deleted

## 2013-11-03 NOTE — Telephone Encounter (Signed)
Message copied by Rico Ala on Fri Nov 03, 2013 11:41 AM ------      Message from: Volanda Napoleon      Created: Fri Nov 03, 2013  6:46 AM       Call - MRI shows NO evidence of recurrent cancer!!!  Laurey Arrow ------

## 2013-11-03 NOTE — Telephone Encounter (Signed)
Called patient to let her know that her MRI showed no evidence of recurrent cancer per dr. Marin Olp

## 2014-01-22 ENCOUNTER — Telehealth: Payer: Self-pay | Admitting: Hematology & Oncology

## 2014-01-22 NOTE — Telephone Encounter (Signed)
Pt moved 7-29 to 8-6

## 2014-01-24 ENCOUNTER — Ambulatory Visit: Payer: No Typology Code available for payment source | Admitting: Hematology & Oncology

## 2014-01-24 ENCOUNTER — Other Ambulatory Visit: Payer: No Typology Code available for payment source | Admitting: Lab

## 2014-02-01 ENCOUNTER — Other Ambulatory Visit (HOSPITAL_BASED_OUTPATIENT_CLINIC_OR_DEPARTMENT_OTHER): Payer: No Typology Code available for payment source | Admitting: Lab

## 2014-02-01 ENCOUNTER — Ambulatory Visit (HOSPITAL_BASED_OUTPATIENT_CLINIC_OR_DEPARTMENT_OTHER): Payer: No Typology Code available for payment source | Admitting: Family

## 2014-02-01 ENCOUNTER — Encounter: Payer: Self-pay | Admitting: Family

## 2014-02-01 VITALS — BP 136/71 | HR 71 | Temp 97.9°F | Resp 14 | Ht 64.0 in | Wt 179.0 lb

## 2014-02-01 DIAGNOSIS — C50919 Malignant neoplasm of unspecified site of unspecified female breast: Secondary | ICD-10-CM

## 2014-02-01 DIAGNOSIS — C50912 Malignant neoplasm of unspecified site of left female breast: Secondary | ICD-10-CM

## 2014-02-01 LAB — COMPREHENSIVE METABOLIC PANEL
ALBUMIN: 4.3 g/dL (ref 3.5–5.2)
ALK PHOS: 83 U/L (ref 39–117)
ALT: 18 U/L (ref 0–35)
AST: 16 U/L (ref 0–37)
BUN: 13 mg/dL (ref 6–23)
CO2: 27 mEq/L (ref 19–32)
Calcium: 9.7 mg/dL (ref 8.4–10.5)
Chloride: 101 mEq/L (ref 96–112)
Creatinine, Ser: 0.78 mg/dL (ref 0.50–1.10)
Glucose, Bld: 200 mg/dL — ABNORMAL HIGH (ref 70–99)
POTASSIUM: 3.9 meq/L (ref 3.5–5.3)
Sodium: 139 mEq/L (ref 135–145)
Total Bilirubin: 0.4 mg/dL (ref 0.2–1.2)
Total Protein: 7.1 g/dL (ref 6.0–8.3)

## 2014-02-01 LAB — CBC WITH DIFFERENTIAL (CANCER CENTER ONLY)
BASO#: 0 10*3/uL (ref 0.0–0.2)
BASO%: 0.9 % (ref 0.0–2.0)
EOS%: 1.6 % (ref 0.0–7.0)
Eosinophils Absolute: 0.1 10*3/uL (ref 0.0–0.5)
HCT: 39.3 % (ref 34.8–46.6)
HEMOGLOBIN: 13.5 g/dL (ref 11.6–15.9)
LYMPH#: 1.9 10*3/uL (ref 0.9–3.3)
LYMPH%: 43.2 % (ref 14.0–48.0)
MCH: 30.3 pg (ref 26.0–34.0)
MCHC: 34.4 g/dL (ref 32.0–36.0)
MCV: 88 fL (ref 81–101)
MONO#: 0.3 10*3/uL (ref 0.1–0.9)
MONO%: 6.9 % (ref 0.0–13.0)
NEUT%: 47.4 % (ref 39.6–80.0)
NEUTROS ABS: 2.1 10*3/uL (ref 1.5–6.5)
Platelets: 248 10*3/uL (ref 145–400)
RBC: 4.45 10*6/uL (ref 3.70–5.32)
RDW: 13.5 % (ref 11.1–15.7)
WBC: 4.4 10*3/uL (ref 3.9–10.0)

## 2014-02-01 NOTE — Progress Notes (Signed)
[place]  [phone] [phone]  ID: [name] [name] OB: 31-Oct-1950 MR#: [id number] [id number] Patient Care Team: Leonard Downing, MD as PCP - General (Family Medicine)  DIAGNOSIS: Locally recurrent infiltrating ductal carcinoma of the left breast.  INTERVAL HISTORY: [name] [name] is here today for her 65mo followup.  She is doing very well and states that her energy is great. She states that she does regular self breast exams and has noticed no changes. She denies fever, chills, headaches, dizziness, SOB, chest pain, palpitations, abdominal pain, constipation, diarrhea, blood in urine or stool. Her appetite is good. She denies any swelling tenderness, numbness or tingling in her extremities. [facility]. She is exercising and eating healthy to avoid having to take a lot of different medications. She is diabetic and is on Glucophage.  CURRENT TREATMENT: Observation  REVIEW OF SYSTEMS: All other 10 point review of systems is negative.   PAST MEDICAL HISTORY: Past Medical History  Diagnosis Date  . Recurrent infiltrating ductal carcinoma of breast 06/18/2011    PAST SURGICAL HISTORY: Past Surgical History  Procedure Laterality Date  . Mastectomy     FAMILY HISTORY No family history on file.  GYNECOLOGIC HISTORY:  No LMP recorded. Patient is postmenopausal.   SOCIAL HISTORY:  History   Social History  . Marital Status: Divorced    Spouse Name: N/A    Number of Children: N/A  . Years of Education: N/A   Occupational History  . Not on file.   Social History Main Topics  . Smoking status: Never Smoker   . Smokeless tobacco: Never Used     Comment: never used product  . Alcohol Use: No  . Drug Use: No  . Sexual Activity: Not on file   Other Topics Concern  . Not on file   Social History Narrative  . No narrative on file   ADVANCED DIRECTIVES: <no information>  HEALTH  MAINTENANCE: History  Substance Use Topics  . Smoking status: Never Smoker   . Smokeless tobacco: Never Used     Comment: never used product  . Alcohol Use: No   Colonoscopy: PAP: Bone density: Lipid panel:  No Known Allergies  Current Outpatient Prescriptions  Medication Sig Dispense Refill  . aspirin EC 81 MG tablet Take 81 mg by mouth daily.      . cholecalciferol (VITAMIN D) 1000 UNITS tablet Take 1,000 Units by mouth 2 (two) times daily.       . metFORMIN (GLUCOPHAGE) 500 MG tablet Take 500 mg by mouth 2 (two) times daily with a meal.      . naproxen sodium (ALEVE) 220 MG tablet Take 220 mg by mouth once.      . ondansetron (ZOFRAN) 4 MG tablet Take 1 tablet (4 mg total) by mouth every 6 (six) hours.  12 tablet  0   No current facility-administered medications for this visit.   OBJECTIVE: Filed Vitals:   02/01/14 1124  BP: 136/71  Pulse: 71  Temp: 97.9 F (36.6 C)  Resp: 14   Body mass index is 30.71 kg/(m^2). ECOG FS:0 - Asymptomatic Ocular: Sclerae unicteric, pupils equal, round and reactive to light Ear-nose-throat: Oropharynx clear, dentition fair Lymphatic: No cervical or supraclavicular adenopathy Lungs no rales or rhonchi, good excursion bilaterally Heart regular rate and rhythm, no murmur appreciated Abd soft, nontender, positive bowel sounds MSK no focal spinal tenderness, no joint edema Neuro: non-focal, well-oriented, appropriate affect  Breasts: no rash, lesions, lumps or changes  LAB RESULTS: CMP     Component Value Date/Time   NA 139 10/22/2013 1530   K 4.0 10/22/2013 1530   CL 98 10/22/2013 1530   CO2 25 10/22/2013 1530   GLUCOSE 137* 10/22/2013 1530   BUN 9 10/22/2013 1530   CREATININE 0.74 10/22/2013 1530   CALCIUM 9.8 10/22/2013 1530   PROT 7.9 10/22/2013 1530   ALBUMIN 4.2 10/22/2013 1530   AST 22 10/22/2013 1530   ALT 32 10/22/2013 1530   ALKPHOS 99 10/22/2013 1530   BILITOT 0.4 10/22/2013 1530   GFRNONAA 89* 10/22/2013 1530   GFRAA >90  10/22/2013 1530   No results found for this basename: SPEP, UPEP,  kappa and lambda light chains   Lab Results  Component Value Date   WBC 4.4 02/01/2014   NEUTROABS 2.1 02/01/2014   HGB 13.5 02/01/2014   HCT 39.3 02/01/2014   MCV 88 02/01/2014   PLT 248 02/01/2014   No results found for this basename: LABCA2   No components found with this basename: PIRJJ884   No results found for this basename: INR,  in the last 168 hours  STUDIES: No results found.  ASSESSMENT/PLAN: [name] [name] is a very pleasant 63 year old African American female with a history of locally recurrent ductal carcinoma of left breast. She underwent mastectomy and reconstruction of the left breast. She had chemotherapy for this.  So far, I see no evidence of recurrence. It has almost been 9 years since diagnosis.  We will plan to get her back in 6 months for labs and follow-up.   I do not see that we need to do any scans on her in between visits. She is in agreement and knows to call here with any questions or concerns. We can certainly see her sooner if need be.   [name] Bottom, NP 02/01/2014 11:58 AM

## 2014-04-01 ENCOUNTER — Encounter (HOSPITAL_COMMUNITY): Payer: Self-pay | Admitting: Emergency Medicine

## 2014-04-01 ENCOUNTER — Emergency Department (HOSPITAL_COMMUNITY)
Admission: EM | Admit: 2014-04-01 | Discharge: 2014-04-02 | Disposition: A | Discharge status: Home / Self Care | Payer: No Typology Code available for payment source | Attending: Emergency Medicine | Admitting: Emergency Medicine

## 2014-04-01 DIAGNOSIS — Z7982 Long term (current) use of aspirin: Secondary | ICD-10-CM | POA: Insufficient documentation

## 2014-04-01 DIAGNOSIS — Z79899 Other long term (current) drug therapy: Secondary | ICD-10-CM | POA: Insufficient documentation

## 2014-04-01 DIAGNOSIS — Z853 Personal history of malignant neoplasm of breast: Secondary | ICD-10-CM | POA: Diagnosis not present

## 2014-04-01 DIAGNOSIS — R112 Nausea with vomiting, unspecified: Secondary | ICD-10-CM | POA: Insufficient documentation

## 2014-04-01 LAB — URINALYSIS, ROUTINE W REFLEX MICROSCOPIC
BILIRUBIN URINE: NEGATIVE
Glucose, UA: NEGATIVE mg/dL
Hgb urine dipstick: NEGATIVE
KETONES UR: 15 mg/dL — AB
Leukocytes, UA: NEGATIVE
Nitrite: NEGATIVE
Protein, ur: NEGATIVE mg/dL
Specific Gravity, Urine: 1.022 (ref 1.005–1.030)
UROBILINOGEN UA: 1 mg/dL (ref 0.0–1.0)
pH: 6 (ref 5.0–8.0)

## 2014-04-01 LAB — I-STAT CHEM 8, ED
BUN: 11 mg/dL (ref 6–23)
CALCIUM ION: 1.13 mmol/L (ref 1.13–1.30)
Chloride: 104 mEq/L (ref 96–112)
Creatinine, Ser: 0.8 mg/dL (ref 0.50–1.10)
Glucose, Bld: 152 mg/dL — ABNORMAL HIGH (ref 70–99)
HCT: 43 % (ref 36.0–46.0)
HEMOGLOBIN: 14.6 g/dL (ref 12.0–15.0)
Potassium: 3.9 mEq/L (ref 3.7–5.3)
SODIUM: 141 meq/L (ref 137–147)
TCO2: 26 mmol/L (ref 0–100)

## 2014-04-01 LAB — CBC
HEMATOCRIT: 40.7 % (ref 36.0–46.0)
Hemoglobin: 13.7 g/dL (ref 12.0–15.0)
MCH: 29.2 pg (ref 26.0–34.0)
MCHC: 33.7 g/dL (ref 30.0–36.0)
MCV: 86.8 fL (ref 78.0–100.0)
Platelets: 259 10*3/uL (ref 150–400)
RBC: 4.69 MIL/uL (ref 3.87–5.11)
RDW: 13.8 % (ref 11.5–15.5)
WBC: 6.7 10*3/uL (ref 4.0–10.5)

## 2014-04-01 MED ORDER — ONDANSETRON HCL 4 MG/2ML IJ SOLN
4.0000 mg | Freq: Once | INTRAMUSCULAR | Status: AC
  Administered 2014-04-01: 4 mg via INTRAVENOUS
  Filled 2014-04-01: qty 2

## 2014-04-01 MED ORDER — SODIUM CHLORIDE 0.9 % IV BOLUS (SEPSIS)
1000.0000 mL | Freq: Once | INTRAVENOUS | Status: AC
  Administered 2014-04-01: 1000 mL via INTRAVENOUS

## 2014-04-01 NOTE — ED Notes (Signed)
Patient began feeling nauseated early afternoon.  She reports lying down for a while and being so dizzy she could not get out of bed.  She reports that she started vomiting then and has been vomiting since then (approx. 1530).

## 2014-04-01 NOTE — ED Notes (Signed)
Pt states having abdominal cramping w/ n/v, denies diarrhea, states she also feels dizzy.

## 2014-04-01 NOTE — ED Notes (Signed)
Pt states abdominal cramping has worsened and pt vomiting.

## 2014-04-01 NOTE — ED Provider Notes (Signed)
CSN: [id number]     Arrival date & time 04/01/14  2120 History   First MD Initiated Contact with Patient 04/01/14 2213     Chief Complaint  Patient presents with  . Emesis     (Consider location/radiation/quality/duration/timing/severity/associated sxs/prior Treatment) HPI Comments: PAteint developed nausea and vomiting about 3:30 this afternoon  Has not tried any medication or therapies for symptoms   Denies diarrhea, fever, UTI symptoms   Patient is a 63 y.o. female presenting with vomiting. The history is provided by the patient.  Emesis Severity:  Moderate Timing:  Intermittent Quality:  Bilious material Progression:  Unchanged Chronicity:  New Recent urination:  Normal Relieved by:  Nothing Worsened by:  Liquids Ineffective treatments:  None tried Associated symptoms: no arthralgias, no chills, no cough, no diarrhea, no fever, no headaches, no myalgias and no URI     Past Medical History  Diagnosis Date  . Recurrent infiltrating ductal carcinoma of breast 06/18/2011   Past Surgical History  Procedure Laterality Date  . Mastectomy     No family history on file. History  Substance Use Topics  . Smoking status: Never Smoker   . Smokeless tobacco: Never Used     Comment: never used product  . Alcohol Use: No   OB History   Grav Para Term Preterm Abortions TAB SAB Ect Mult Living                 Review of Systems  Constitutional: Negative for fever and chills.  Respiratory: Negative for shortness of breath.   Cardiovascular: Negative for chest pain.  Gastrointestinal: Positive for nausea and vomiting. Negative for diarrhea and constipation.       Describes crampy pain just prior to emesis   Genitourinary: Negative for dysuria.  Musculoskeletal: Negative for arthralgias and myalgias.  Skin: Negative for rash.  Neurological: Negative for dizziness and headaches.  All other systems reviewed and are negative.     Allergies  Review of patient's allergies  indicates no known allergies.  Home Medications   Prior to Admission medications   Medication Sig Start Date End Date Taking? Authorizing Provider  aspirin EC 81 MG tablet Take 81 mg by mouth daily.   Yes Historical Provider, MD  cholecalciferol (VITAMIN D) 1000 UNITS tablet Take 1,000 Units by mouth 2 (two) times daily.    Yes Historical Provider, MD  metFORMIN (GLUCOPHAGE) 500 MG tablet Take 500 mg by mouth 2 (two) times daily with a meal.   Yes Historical Provider, MD  ondansetron (ZOFRAN ODT) 4 MG disintegrating tablet Take 1 tablet (4 mg total) by mouth every 8 (eight) hours as needed for nausea or vomiting. 04/02/14   Garald Balding, NP   BP 134/47  Pulse 81  Temp(Src) 98.2 F (36.8 C) (Axillary)  SpO2 96% Physical Exam  Nursing note and vitals reviewed. Constitutional: She appears well-developed and well-nourished.  HENT:  Head: Normocephalic.  Mouth/Throat: Oropharynx is clear and moist.  Eyes: Pupils are equal, round, and reactive to light.  Neck: Normal range of motion.  Cardiovascular: Normal rate and normal heart sounds.   Pulmonary/Chest: Effort normal and breath sounds normal.  Abdominal: Soft. Bowel sounds are normal. She exhibits no distension. There is no tenderness.  Musculoskeletal: Normal range of motion.  Neurological: She is alert.  Skin: Skin is warm and dry. No rash noted. There is pallor.    ED Course  Procedures (including critical care time) Labs Review Labs Reviewed  URINALYSIS, ROUTINE W REFLEX MICROSCOPIC -  Abnormal; Notable for the following:    APPearance CLOUDY (*)    Ketones, ur 15 (*)    All other components within normal limits  I-STAT CHEM 8, ED - Abnormal; Notable for the following:    Glucose, Bld 152 (*)    All other components within normal limits  CBC    Imaging Review No results found.   EKG Interpretation None     After a liter fluid.  Patient is feeling significantly better, but when ambulated, still feeling slightly  dizzy, but received a second liter of fluid, and reassess Only dizzy after second liter fluid, tolerating by mouth challenge MDM   Final diagnoses:  Non-intractable vomiting with nausea, vomiting of unspecified type         Garald Balding, NP 04/02/14 [phone]

## 2014-04-02 MED ORDER — ONDANSETRON 4 MG PO TBDP: 4.0000 mg | ORAL_TABLET | Freq: Three times a day (TID) | ORAL | Status: DC | PRN

## 2014-04-02 MED ORDER — SODIUM CHLORIDE 0.9 % IV BOLUS (SEPSIS)
1000.0000 mL | Freq: Once | INTRAVENOUS | Status: AC
  Administered 2014-04-02: 1000 mL via INTRAVENOUS

## 2014-04-02 NOTE — ED Notes (Signed)
Pt able to keep down ginger ale, states she still feels dizzy when ambulating to restroom.

## 2014-04-02 NOTE — Discharge Instructions (Signed)

## 2014-04-02 NOTE — ED Notes (Signed)
Pt given ginger ale as PO challenge.

## 2014-04-02 NOTE — ED Provider Notes (Signed)
Medical screening examination/treatment/procedure(s) were performed by non-physician practitioner and as supervising physician I was immediately available for consultation/collaboration.   EKG Interpretation None       Babette Relic, MD 04/02/14 [phone]

## 2014-04-25 ENCOUNTER — Telehealth: Payer: Self-pay | Admitting: Hematology & Oncology

## 2014-04-25 NOTE — Telephone Encounter (Signed)
Medical Records request from [facility] faxed today tp:  F: [phone] P: [phone]        COPY SCANNED

## 2014-08-02 ENCOUNTER — Other Ambulatory Visit: Payer: Self-pay | Admitting: *Deleted

## 2014-08-02 DIAGNOSIS — C50912 Malignant neoplasm of unspecified site of left female breast: Secondary | ICD-10-CM

## 2014-08-03 ENCOUNTER — Ambulatory Visit (HOSPITAL_BASED_OUTPATIENT_CLINIC_OR_DEPARTMENT_OTHER): Payer: 59 | Admitting: Hematology & Oncology

## 2014-08-03 ENCOUNTER — Other Ambulatory Visit (HOSPITAL_BASED_OUTPATIENT_CLINIC_OR_DEPARTMENT_OTHER): Payer: 59 | Admitting: Lab

## 2014-08-03 VITALS — BP 129/47 | HR 76 | Temp 97.4°F | Resp 16 | Wt 179.0 lb

## 2014-08-03 DIAGNOSIS — Z853 Personal history of malignant neoplasm of breast: Secondary | ICD-10-CM

## 2014-08-03 DIAGNOSIS — C50912 Malignant neoplasm of unspecified site of left female breast: Secondary | ICD-10-CM

## 2014-08-03 LAB — COMPREHENSIVE METABOLIC PANEL
ALK PHOS: 90 U/L (ref 39–117)
ALT: 27 U/L (ref 0–35)
AST: 19 U/L (ref 0–37)
Albumin: 4.2 g/dL (ref 3.5–5.2)
BUN: 12 mg/dL (ref 6–23)
CHLORIDE: 104 meq/L (ref 96–112)
CO2: 24 mEq/L (ref 19–32)
CREATININE: 0.74 mg/dL (ref 0.50–1.10)
Calcium: 9.3 mg/dL (ref 8.4–10.5)
Glucose, Bld: 108 mg/dL — ABNORMAL HIGH (ref 70–99)
Potassium: 4.1 mEq/L (ref 3.5–5.3)
Sodium: 140 mEq/L (ref 135–145)
TOTAL PROTEIN: 6.8 g/dL (ref 6.0–8.3)
Total Bilirubin: 0.5 mg/dL (ref 0.2–1.2)

## 2014-08-03 LAB — CBC WITH DIFFERENTIAL (CANCER CENTER ONLY)
BASO#: 0 10*3/uL (ref 0.0–0.2)
BASO%: 0.8 % (ref 0.0–2.0)
EOS%: 2.1 % (ref 0.0–7.0)
Eosinophils Absolute: 0.1 10*3/uL (ref 0.0–0.5)
HCT: 36.7 % (ref 34.8–46.6)
HGB: 12.3 g/dL (ref 11.6–15.9)
LYMPH#: 2 10*3/uL (ref 0.9–3.3)
LYMPH%: 41.7 % (ref 14.0–48.0)
MCH: 29.8 pg (ref 26.0–34.0)
MCHC: 33.5 g/dL (ref 32.0–36.0)
MCV: 89 fL (ref 81–101)
MONO#: 0.4 10*3/uL (ref 0.1–0.9)
MONO%: 7.9 % (ref 0.0–13.0)
NEUT%: 47.5 % (ref 39.6–80.0)
NEUTROS ABS: 2.3 10*3/uL (ref 1.5–6.5)
PLATELETS: 251 10*3/uL (ref 145–400)
RBC: 4.13 10*6/uL (ref 3.70–5.32)
RDW: 14 % (ref 11.1–15.7)
WBC: 4.8 10*3/uL (ref 3.9–10.0)

## 2014-08-03 NOTE — Progress Notes (Signed)
Hematology and Oncology Follow Up Visit  NAZIYA HEGWOOD [id number] 09-26-1950 64 y.o. 08/03/2014   Principle Diagnosis:   Locally recurrent infiltrating ductal carcinoma of the left breast  Current Therapy:    Observation     Interim History:  Ms.  Creighton is back for follow-up. We see her every 6 months. Since we last saw her, she's been doing quite well. She does like to travel. [place].  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  Her blood sugars have been doing pretty well. She's had no problems with metformin.  Her last mammogram was done back in March 2015. Everything looked okay. She's had no problems with bony pain. She's had no cough. There's been no change in bowel or bladder habits. She's had no leg swelling. She's had no rashes.  Her performance status is ECOG 0.  Medications:  Current outpatient prescriptions:  .  aspirin EC 81 MG tablet, Take 81 mg by mouth daily., Disp: , Rfl:  .  cholecalciferol (VITAMIN D) 1000 UNITS tablet, Take 1,000 Units by mouth 2 (two) times daily. , Disp: , Rfl:  .  metFORMIN (GLUCOPHAGE) 500 MG tablet, Take 500 mg by mouth 2 (two) times daily with a meal., Disp: , Rfl:  .  ondansetron (ZOFRAN ODT) 4 MG disintegrating tablet, Take 1 tablet (4 mg total) by mouth every 8 (eight) hours as needed for nausea or vomiting. (Patient not taking: Reported on 08/03/2014), Disp: 20 tablet, Rfl: 0  Allergies: No Known Allergies  Past Medical History, Surgical history, Social history, and Family History were reviewed and updated.  Review of Systems: As above  Physical Exam:  weight is 179 lb (81.194 kg). Her oral temperature is 97.4 F (36.3 C). Her blood pressure is 129/47 and her pulse is 76. Her respiration is 16.   Well-developed and well-nourished African-American female in no obvious distress. Head and neck exam shows no ocular or oral lesions. There are no palpable cervical or  supraclavicular lymph nodes. Lungs are clear. Cardiac exam regular rate and rhythm with no murmurs, rubs or bruits. Breast exam shows right breast with no masses, edema or erythema. There is no right axillary adenopathy. Left breast has a mastectomy. She has reconstruction of the left breast. There is no left axillary adenopathy. Abdomen is soft. She has good bowel sounds. She has no fluid wave. There is no palpable liver or spleen tip. Back exam shows no tenderness over the spine, ribs or hips. Extremities shows no clubbing, cyanosis or edema. There is no lymphedema of the left arm. Neurological exam is nonfocal.  Lab Results  Component Value Date   WBC 4.8 08/03/2014   HGB 12.3 08/03/2014   HCT 36.7 08/03/2014   MCV 89 08/03/2014   PLT 251 08/03/2014     Chemistry      Component Value Date/Time   NA 141 04/01/2014 2249   K 3.9 04/01/2014 2249   CL 104 04/01/2014 2249   CO2 27 02/01/2014 1028   BUN 11 04/01/2014 2249   CREATININE 0.80 04/01/2014 2249      Component Value Date/Time   CALCIUM 9.7 02/01/2014 1028   ALKPHOS 83 02/01/2014 1028   AST 16 02/01/2014 1028   ALT 18 02/01/2014 1028   BILITOT 0.4 02/01/2014 1028         Impression and Plan: Ms. Inboden is 64 year old African American female with a history of locally recurrent ductal carcinoma of  the left breast. She underwent mastectomy followed by reconstruction.  Spend about 10 years since she had a recurrence.  I do not see any problems with breast cancer right now.  I do not see any need for any scans.  We will plan to get her back in another 6 months.  I will make sure that we schedule a mammogram for her in March.   Volanda Napoleon, MD 2/5/201611:04 AM

## 2014-09-17 ENCOUNTER — Other Ambulatory Visit: Payer: Self-pay | Admitting: Hematology & Oncology

## 2014-09-17 ENCOUNTER — Ambulatory Visit
Admission: RE | Admit: 2014-09-17 | Discharge: 2014-09-17 | Disposition: A | Discharge status: Home / Self Care | Payer: 59 | Source: Ambulatory Visit | Attending: Hematology & Oncology | Admitting: Hematology & Oncology

## 2014-09-17 DIAGNOSIS — Z1231 Encounter for screening mammogram for malignant neoplasm of breast: Secondary | ICD-10-CM

## 2015-02-01 ENCOUNTER — Ambulatory Visit: Payer: 59 | Admitting: Hematology & Oncology

## 2015-02-01 ENCOUNTER — Other Ambulatory Visit: Payer: 59

## 2015-07-04 DIAGNOSIS — J069 Acute upper respiratory infection, unspecified: Secondary | ICD-10-CM | POA: Diagnosis not present

## 2015-09-18 DIAGNOSIS — E7800 Pure hypercholesterolemia, unspecified: Secondary | ICD-10-CM | POA: Diagnosis not present

## 2015-09-18 DIAGNOSIS — Z23 Encounter for immunization: Secondary | ICD-10-CM | POA: Diagnosis not present

## 2015-09-18 DIAGNOSIS — E119 Type 2 diabetes mellitus without complications: Secondary | ICD-10-CM | POA: Diagnosis not present

## 2015-09-18 DIAGNOSIS — Z853 Personal history of malignant neoplasm of breast: Secondary | ICD-10-CM | POA: Diagnosis not present

## 2015-09-18 DIAGNOSIS — Z7984 Long term (current) use of oral hypoglycemic drugs: Secondary | ICD-10-CM | POA: Diagnosis not present

## 2015-09-22 DIAGNOSIS — E1165 Type 2 diabetes mellitus with hyperglycemia: Secondary | ICD-10-CM | POA: Diagnosis not present

## 2015-09-22 DIAGNOSIS — Z7984 Long term (current) use of oral hypoglycemic drugs: Secondary | ICD-10-CM | POA: Diagnosis not present

## 2015-10-21 ENCOUNTER — Other Ambulatory Visit: Payer: Self-pay

## 2015-10-21 DIAGNOSIS — Z1231 Encounter for screening mammogram for malignant neoplasm of breast: Secondary | ICD-10-CM

## 2015-10-23 ENCOUNTER — Other Ambulatory Visit: Payer: Self-pay | Admitting: Hematology & Oncology

## 2015-10-23 DIAGNOSIS — Z9889 Other specified postprocedural states: Secondary | ICD-10-CM

## 2015-10-23 DIAGNOSIS — Z9011 Acquired absence of right breast and nipple: Secondary | ICD-10-CM

## 2015-10-23 DIAGNOSIS — N63 Unspecified lump in unspecified breast: Secondary | ICD-10-CM

## 2015-10-28 ENCOUNTER — Other Ambulatory Visit: Payer: Self-pay | Admitting: Hematology & Oncology

## 2015-10-28 ENCOUNTER — Ambulatory Visit
Admission: RE | Admit: 2015-10-28 | Discharge: 2015-10-28 | Disposition: A | Discharge status: Home / Self Care | Payer: Commercial Managed Care - HMO | Source: Ambulatory Visit | Attending: Hematology & Oncology | Admitting: Hematology & Oncology

## 2015-10-28 DIAGNOSIS — N63 Unspecified lump in unspecified breast: Secondary | ICD-10-CM

## 2015-10-28 DIAGNOSIS — N6489 Other specified disorders of breast: Secondary | ICD-10-CM | POA: Diagnosis not present

## 2015-10-28 DIAGNOSIS — Z9011 Acquired absence of right breast and nipple: Secondary | ICD-10-CM

## 2015-10-28 DIAGNOSIS — R928 Other abnormal and inconclusive findings on diagnostic imaging of breast: Secondary | ICD-10-CM | POA: Diagnosis not present

## 2015-10-28 DIAGNOSIS — Z9889 Other specified postprocedural states: Secondary | ICD-10-CM

## 2015-10-28 DIAGNOSIS — Z9012 Acquired absence of left breast and nipple: Secondary | ICD-10-CM

## 2015-11-13 DIAGNOSIS — J069 Acute upper respiratory infection, unspecified: Secondary | ICD-10-CM | POA: Diagnosis not present

## 2015-11-13 DIAGNOSIS — R197 Diarrhea, unspecified: Secondary | ICD-10-CM | POA: Diagnosis not present

## 2016-03-17 ENCOUNTER — Emergency Department (HOSPITAL_COMMUNITY)
Admission: EM | Admit: 2016-03-17 | Discharge: 2016-03-18 | Disposition: A | Discharge status: Home / Self Care | Payer: Commercial Managed Care - HMO | Attending: Emergency Medicine | Admitting: Emergency Medicine

## 2016-03-17 ENCOUNTER — Emergency Department (HOSPITAL_COMMUNITY): Payer: Commercial Managed Care - HMO

## 2016-03-17 ENCOUNTER — Encounter (HOSPITAL_COMMUNITY): Payer: Self-pay | Admitting: Emergency Medicine

## 2016-03-17 DIAGNOSIS — E119 Type 2 diabetes mellitus without complications: Secondary | ICD-10-CM | POA: Diagnosis not present

## 2016-03-17 DIAGNOSIS — R0789 Other chest pain: Secondary | ICD-10-CM | POA: Diagnosis not present

## 2016-03-17 DIAGNOSIS — Z853 Personal history of malignant neoplasm of breast: Secondary | ICD-10-CM | POA: Diagnosis not present

## 2016-03-17 DIAGNOSIS — Z79899 Other long term (current) drug therapy: Secondary | ICD-10-CM | POA: Insufficient documentation

## 2016-03-17 DIAGNOSIS — Z7984 Long term (current) use of oral hypoglycemic drugs: Secondary | ICD-10-CM | POA: Insufficient documentation

## 2016-03-17 DIAGNOSIS — R079 Chest pain, unspecified: Secondary | ICD-10-CM

## 2016-03-17 DIAGNOSIS — Z7982 Long term (current) use of aspirin: Secondary | ICD-10-CM | POA: Diagnosis not present

## 2016-03-17 HISTORY — DX: Type 2 diabetes mellitus without complications: E11.9

## 2016-03-17 LAB — I-STAT TROPONIN, ED: TROPONIN I, POC: 0 ng/mL (ref 0.00–0.08)

## 2016-03-17 LAB — BASIC METABOLIC PANEL
ANION GAP: 9 (ref 5–15)
BUN: 12 mg/dL (ref 6–20)
CALCIUM: 9.8 mg/dL (ref 8.9–10.3)
CO2: 24 mmol/L (ref 22–32)
CREATININE: 0.91 mg/dL (ref 0.44–1.00)
Chloride: 105 mmol/L (ref 101–111)
Glucose, Bld: 94 mg/dL (ref 65–99)
Potassium: 4.1 mmol/L (ref 3.5–5.1)
SODIUM: 138 mmol/L (ref 135–145)

## 2016-03-17 LAB — CBC
HCT: 38.9 % (ref 36.0–46.0)
HEMOGLOBIN: 13.4 g/dL (ref 12.0–15.0)
MCH: 30.2 pg (ref 26.0–34.0)
MCHC: 34.4 g/dL (ref 30.0–36.0)
MCV: 87.8 fL (ref 78.0–100.0)
PLATELETS: 272 10*3/uL (ref 150–400)
RBC: 4.43 MIL/uL (ref 3.87–5.11)
RDW: 13.9 % (ref 11.5–15.5)
WBC: 6.3 10*3/uL (ref 4.0–10.5)

## 2016-03-17 LAB — D-DIMER, QUANTITATIVE: D-Dimer, Quant: <0.27 ug/mL-FEU (ref 0.00–0.50)

## 2016-03-17 NOTE — ED Triage Notes (Signed)
Pt states 3 days ago she had central chest pain that felt as a heavy weight on her chest and SOB on exertion. Pt c/o slight pressure in her chest and headache.

## 2016-03-18 NOTE — ED Provider Notes (Signed)
[facility] DEPT Provider Note   CSN: [id number] Arrival date & time: 03/17/16  2108     History   Chief Complaint Chief Complaint  Patient presents with  . Chest Pain    HPI [name] [name] is a 65 y.o. female.  Upper central chest pain described as a weight for several days, worse with swallowing. She is short of breath with exertion, but no diaphoresis or dyspnea at rest. Review systems positive for headache, nausea, feeling hot. Aspect history includes diabetes in breast cancer in remission. Nonsmoker. No hypertension. No hypercholesterolemia. Family history negative for cardiac disease.      Past Medical History:  Diagnosis Date  . Diabetes mellitus without complication ([place])   . Recurrent infiltrating ductal carcinoma of breast ([place]) 06/18/2011    Patient Active Problem List   Diagnosis Date Noted  . Recurrent infiltrating ductal carcinoma of breast ([place]) 06/18/2011    Past Surgical History:  Procedure Laterality Date  . MASTECTOMY      OB History    No data available       Home Medications    Prior to Admission medications   Medication Sig Start Date End Date Taking? Authorizing Provider  aspirin EC 81 MG tablet Take 81 mg by mouth daily.   Yes Historical Provider, MD  cholecalciferol (VITAMIN D) 1000 UNITS tablet Take 1,000 Units by mouth 2 (two) times daily.    Yes Historical Provider, MD  metFORMIN (GLUCOPHAGE) 500 MG tablet Take 500 mg by mouth 2 (two) times daily with a meal.   Yes Historical Provider, MD  ondansetron (ZOFRAN ODT) 4 MG disintegrating tablet Take 1 tablet (4 mg total) by mouth every 8 (eight) hours as needed for nausea or vomiting. Patient not taking: Reported on 08/03/2014 04/02/14   Junius Creamer, NP    Family History No family history on file.  Social History Social History  Substance Use Topics  . Smoking status: Never Smoker  . Smokeless tobacco: Never Used     Comment: never used product  . Alcohol use No      Allergies   Review of patient's allergies indicates no known allergies.   Review of Systems Review of Systems  All other systems reviewed and are negative.    Physical Exam Updated Vital Signs BP 123/82 (BP Location: Right Arm)   Pulse 77   Temp 97.3 F (36.3 C)   Resp 19   SpO2 95%   Physical Exam  Constitutional: She is oriented to person, place, and time. She appears well-developed and well-nourished.  HENT:  Head: Normocephalic and atraumatic.  Eyes: Conjunctivae are normal.  Neck: Neck supple.  Cardiovascular: Normal rate and regular rhythm.   Pulmonary/Chest: Effort normal and breath sounds normal.  Abdominal: Soft. Bowel sounds are normal.  Musculoskeletal: Normal range of motion.  Neurological: She is alert and oriented to person, place, and time.  Skin: Skin is warm and dry.  Psychiatric: She has a normal mood and affect. Her behavior is normal.  Nursing note and vitals reviewed.    ED Treatments / Results  Labs (all labs ordered are listed, but only abnormal results are displayed) Labs Reviewed  BASIC METABOLIC PANEL  CBC  D-DIMER, QUANTITATIVE (NOT AT [facility])  Randolm Idol, ED    EKG  EKG Interpretation  Date/Time:  Tuesday March 17 2016 21:18:31 EDT Ventricular Rate:  85 PR Interval:    QRS Duration: 83 QT Interval:  375 QTC Calculation: 446 R Axis:   40 Text Interpretation:  Sinus rhythm Low voltage, extremity and precordial leads Baseline wander in lead(s) V3 Confirmed by Lacinda Axon  MD, Avrey Flanagin ([id number]) on 03/17/2016 11:58:04 PM       Radiology Dg Chest 2 View  Result Date: 03/17/2016 CLINICAL DATA:  Acute onset of sternal chest pain, shortness of breath and headache. Initial encounter. EXAM: CHEST  2 VIEW COMPARISON:  Chest radiograph performed 04/05/2009 FINDINGS: The lungs are well-aerated and clear. There is no evidence of focal opacification, pleural effusion or pneumothorax. The heart is borderline normal in size. No acute  osseous abnormalities are seen. IMPRESSION: No acute cardiopulmonary process seen. Electronically Signed   By: Garald Balding M.D.   On: 03/17/2016 22:23    Procedures Procedures (including critical care time)  Medications Ordered in ED Medications - No data to display   Initial Impression / Assessment and Plan / ED Course  I have reviewed the triage vital signs and the nursing notes.  Pertinent labs & imaging results that were available during my care of the patient were reviewed by me and considered in my medical decision making (see chart for details).  Clinical Course    History is suggestive of angina. EKG, chest x-ray, troponin, d-dimer all negative. She has primary care follow-up. She will return if worse.  Final Clinical Impressions(s) / ED Diagnoses   Final diagnoses:  Chest pain, unspecified chest pain type    New Prescriptions New Prescriptions   No medications on file     Nat Christen, MD 03/18/16 0013

## 2016-03-18 NOTE — Discharge Instructions (Signed)
Tests show no life-threatening condition. Follow-up your primary care doctor or return if worse.

## 2016-03-23 DIAGNOSIS — Z23 Encounter for immunization: Secondary | ICD-10-CM | POA: Diagnosis not present

## 2016-03-23 DIAGNOSIS — E1165 Type 2 diabetes mellitus with hyperglycemia: Secondary | ICD-10-CM | POA: Diagnosis not present

## 2016-03-23 DIAGNOSIS — Z124 Encounter for screening for malignant neoplasm of cervix: Secondary | ICD-10-CM | POA: Diagnosis not present

## 2016-03-23 DIAGNOSIS — Z0000 Encounter for general adult medical examination without abnormal findings: Secondary | ICD-10-CM | POA: Diagnosis not present

## 2016-03-23 DIAGNOSIS — Z853 Personal history of malignant neoplasm of breast: Secondary | ICD-10-CM | POA: Diagnosis not present

## 2016-03-23 DIAGNOSIS — E7800 Pure hypercholesterolemia, unspecified: Secondary | ICD-10-CM | POA: Diagnosis not present

## 2016-04-23 DIAGNOSIS — Z780 Asymptomatic menopausal state: Secondary | ICD-10-CM | POA: Diagnosis not present

## 2016-04-23 DIAGNOSIS — M8588 Other specified disorders of bone density and structure, other site: Secondary | ICD-10-CM | POA: Diagnosis not present

## 2016-07-29 DIAGNOSIS — R0789 Other chest pain: Secondary | ICD-10-CM | POA: Diagnosis not present

## 2016-09-22 DIAGNOSIS — E1165 Type 2 diabetes mellitus with hyperglycemia: Secondary | ICD-10-CM | POA: Diagnosis not present

## 2016-09-22 DIAGNOSIS — E7800 Pure hypercholesterolemia, unspecified: Secondary | ICD-10-CM | POA: Diagnosis not present

## 2016-09-22 DIAGNOSIS — Z23 Encounter for immunization: Secondary | ICD-10-CM | POA: Diagnosis not present

## 2016-11-18 ENCOUNTER — Other Ambulatory Visit: Payer: Self-pay | Admitting: Hematology & Oncology

## 2016-11-18 DIAGNOSIS — Z9012 Acquired absence of left breast and nipple: Secondary | ICD-10-CM

## 2016-11-18 DIAGNOSIS — Z1231 Encounter for screening mammogram for malignant neoplasm of breast: Secondary | ICD-10-CM

## 2016-11-27 ENCOUNTER — Ambulatory Visit
Admission: RE | Admit: 2016-11-27 | Discharge: 2016-11-27 | Disposition: A | Discharge status: Home / Self Care | Payer: Commercial Managed Care - HMO | Source: Ambulatory Visit | Attending: Hematology & Oncology | Admitting: Hematology & Oncology

## 2016-11-27 DIAGNOSIS — Z9012 Acquired absence of left breast and nipple: Secondary | ICD-10-CM

## 2016-11-27 DIAGNOSIS — Z1231 Encounter for screening mammogram for malignant neoplasm of breast: Secondary | ICD-10-CM

## 2017-01-04 DIAGNOSIS — Z7984 Long term (current) use of oral hypoglycemic drugs: Secondary | ICD-10-CM | POA: Diagnosis not present

## 2017-01-04 DIAGNOSIS — E1165 Type 2 diabetes mellitus with hyperglycemia: Secondary | ICD-10-CM | POA: Diagnosis not present

## 2017-03-26 DIAGNOSIS — E1165 Type 2 diabetes mellitus with hyperglycemia: Secondary | ICD-10-CM | POA: Diagnosis not present

## 2017-03-26 DIAGNOSIS — Z0000 Encounter for general adult medical examination without abnormal findings: Secondary | ICD-10-CM | POA: Diagnosis not present

## 2017-03-26 DIAGNOSIS — E7800 Pure hypercholesterolemia, unspecified: Secondary | ICD-10-CM | POA: Diagnosis not present

## 2017-03-26 DIAGNOSIS — Z01419 Encounter for gynecological examination (general) (routine) without abnormal findings: Secondary | ICD-10-CM | POA: Diagnosis not present

## 2017-03-26 DIAGNOSIS — Z1389 Encounter for screening for other disorder: Secondary | ICD-10-CM | POA: Diagnosis not present

## 2017-03-26 DIAGNOSIS — Z853 Personal history of malignant neoplasm of breast: Secondary | ICD-10-CM | POA: Diagnosis not present

## 2017-03-26 DIAGNOSIS — Z23 Encounter for immunization: Secondary | ICD-10-CM | POA: Diagnosis not present

## 2017-03-26 DIAGNOSIS — Z124 Encounter for screening for malignant neoplasm of cervix: Secondary | ICD-10-CM | POA: Diagnosis not present

## 2017-03-26 DIAGNOSIS — Z7984 Long term (current) use of oral hypoglycemic drugs: Secondary | ICD-10-CM | POA: Diagnosis not present

## 2017-05-14 DIAGNOSIS — E1165 Type 2 diabetes mellitus with hyperglycemia: Secondary | ICD-10-CM | POA: Diagnosis not present

## 2017-05-14 DIAGNOSIS — Z7984 Long term (current) use of oral hypoglycemic drugs: Secondary | ICD-10-CM | POA: Diagnosis not present

## 2017-05-31 DIAGNOSIS — R42 Dizziness and giddiness: Secondary | ICD-10-CM | POA: Diagnosis not present

## 2017-05-31 DIAGNOSIS — R030 Elevated blood-pressure reading, without diagnosis of hypertension: Secondary | ICD-10-CM | POA: Diagnosis not present

## 2017-08-05 ENCOUNTER — Other Ambulatory Visit: Payer: Self-pay | Admitting: *Deleted

## 2017-08-05 DIAGNOSIS — C50912 Malignant neoplasm of unspecified site of left female breast: Secondary | ICD-10-CM

## 2017-08-06 ENCOUNTER — Other Ambulatory Visit: Payer: Self-pay

## 2017-08-06 ENCOUNTER — Inpatient Hospital Stay: Payer: Medicare HMO | Attending: Hematology & Oncology | Admitting: Hematology & Oncology

## 2017-08-06 ENCOUNTER — Inpatient Hospital Stay: Payer: Medicare HMO

## 2017-08-06 VITALS — BP 139/58 | HR 94 | Temp 98.2°F | Resp 16 | Wt 171.0 lb

## 2017-08-06 DIAGNOSIS — Z9012 Acquired absence of left breast and nipple: Secondary | ICD-10-CM

## 2017-08-06 DIAGNOSIS — R0789 Other chest pain: Secondary | ICD-10-CM

## 2017-08-06 DIAGNOSIS — Z853 Personal history of malignant neoplasm of breast: Secondary | ICD-10-CM

## 2017-08-06 DIAGNOSIS — C50912 Malignant neoplasm of unspecified site of left female breast: Secondary | ICD-10-CM

## 2017-08-06 LAB — CMP (CANCER CENTER ONLY)
ALT: 26 U/L (ref 0–55)
ANION GAP: 8 (ref 5–15)
AST: 23 U/L (ref 5–34)
Albumin: 3.5 g/dL (ref 3.5–5.0)
Alkaline Phosphatase: 81 U/L (ref 26–84)
BUN: 7 mg/dL (ref 7–22)
CHLORIDE: 105 mmol/L (ref 98–108)
CO2: 30 mmol/L (ref 18–33)
Calcium: 9.5 mg/dL (ref 8.0–10.3)
Creatinine: 0.8 mg/dL (ref 0.60–1.10)
Glucose, Bld: 256 mg/dL — ABNORMAL HIGH (ref 73–118)
POTASSIUM: 3.5 mmol/L (ref 3.3–4.7)
SODIUM: 143 mmol/L (ref 128–145)
TOTAL PROTEIN: 7.1 g/dL (ref 6.4–8.1)
Total Bilirubin: 0.7 mg/dL (ref 0.2–1.2)

## 2017-08-06 LAB — CBC WITH DIFFERENTIAL (CANCER CENTER ONLY)
BASOS ABS: 0 10*3/uL (ref 0.0–0.1)
Basophils Relative: 1 %
EOS PCT: 1 %
Eosinophils Absolute: 0.1 10*3/uL (ref 0.0–0.5)
HEMATOCRIT: 37.6 % (ref 34.8–46.6)
Hemoglobin: 12.5 g/dL (ref 11.6–15.9)
LYMPHS ABS: 1.8 10*3/uL (ref 0.9–3.3)
Lymphocytes Relative: 34 %
MCH: 30 pg (ref 26.0–34.0)
MCHC: 33.2 g/dL (ref 32.0–36.0)
MCV: 90.4 fL (ref 81.0–101.0)
MONO ABS: 0.3 10*3/uL (ref 0.1–0.9)
Monocytes Relative: 6 %
NEUTROS ABS: 3 10*3/uL (ref 1.5–6.5)
Neutrophils Relative %: 58 %
Platelet Count: 242 10*3/uL (ref 145–400)
RBC: 4.16 MIL/uL (ref 3.70–5.32)
RDW: 13.4 % (ref 11.1–15.7)
WBC Count: 5.1 10*3/uL (ref 3.9–10.0)

## 2017-08-06 NOTE — Progress Notes (Signed)
Referral MD  Reason for Referral: History of locally recurrent ductal carcinoma of the left breast.  Chief Complaint  Patient presents with  . Follow-up  : I am having some chest pain on the left side   HPI: [name] [name] is a nice 67 year old African-American female.  She is well-known to me.  I am not seeing her for 3 years.  She has a history of locally recurrent ductal carcinoma of the left breast.  She had mastectomy followed by reconstruction.  This was about 12 years ago.  She began to have some left sided chest wall pain of a couple weeks ago.  This was not associated with any shortness of breath.  She has some swelling on the left side of the chest.  She had no fever.  She had no redness of the skin.  She has some tenderness in the left axilla.  There was no weight loss or weight gain.  She has had no nausea or vomiting.  She has had no bony pain.  There is been no arm swelling on the left side.  She felt that she needed to be seen to make sure that there is nothing going on with respect to her breast cancer coming back.  She has had no bleeding.  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  Overall, her performance status is ECOG 1.   Past Medical History:  Diagnosis Date  . Diabetes mellitus without complication ([place])   . Recurrent infiltrating ductal carcinoma of breast ([place]) 06/18/2011  :  Past Surgical History:  Procedure Laterality Date  . MASTECTOMY    :   Current Outpatient Medications:  .  aspirin EC 81 MG tablet, Take 81 mg by mouth daily., Disp: , Rfl:  .  cholecalciferol (VITAMIN D) 1000 UNITS tablet, Take 1,000 Units by mouth 2 (two) times daily. , Disp: , Rfl:  .  metFORMIN (GLUCOPHAGE) 500 MG tablet, Take 500 mg by mouth 2 (two) times daily with a meal., Disp: , Rfl:  .  ondansetron (ZOFRAN ODT) 4 MG disintegrating tablet, Take 1 tablet (4 mg total) by mouth every 8 (eight) hours as needed for nausea or vomiting. (Patient not taking: Reported on  08/03/2014), Disp: 20 tablet, Rfl: 0:  :  No Known Allergies:  No family history on file.:  Social History   Socioeconomic History  . Marital status: Divorced    Spouse name: Not on file  . Number of children: Not on file  . Years of education: Not on file  . Highest education level: Not on file  Social Needs  . Financial resource strain: Not on file  . Food insecurity - worry: Not on file  . Food insecurity - inability: Not on file  . Transportation needs - medical: Not on file  . Transportation needs - non-medical: Not on file  Occupational History  . Not on file  Tobacco Use  . Smoking status: Never Smoker  . Smokeless tobacco: Never Used  . Tobacco comment: never used product  Substance and Sexual Activity  . Alcohol use: No  . Drug use: No  . Sexual activity: Not on file  Other Topics Concern  . Not on file  Social History Narrative  . Not on file  :  Review of Systems  Constitutional: Negative.   HENT: Negative.   Eyes: Negative.   Respiratory: Negative.   Cardiovascular: Positive for chest pain.  Gastrointestinal: Negative.   Genitourinary: Negative.   Musculoskeletal: Positive for  myalgias.  Skin: Negative.   Neurological: Negative.   Endo/Heme/Allergies: Negative.   Psychiatric/Behavioral: Negative.      Exam: Well-developed and well-nourished African-American female in no obvious distress.  Vital signs show temperature of 98.2.  Pulse 94.  Blood pressure 139/58.  Weight is 171 pounds.  Head neck exam shows no ocular or oral lesions.  There are no palpable cervical or supraclavicular lymph nodes.  Lungs are clear bilaterally.  Cardiac exam regular rate and rhythm with no murmurs, rubs or bruits.  Breast exam shows right breast no masses, edema or erythema.  There is no right axillary adenopathy.  Left chest wall shows a reconstruction.  She has some slight swelling in the upper outer portion of the reconstruction site.  There is no erythema.  There is no  warmth to palpation.  There is no distinct mass s noted in the reconstructed left breast.  There is some  fullness in the left axilla.  She has some tenderness in the left axilla.  Abdomen is soft.  She has good bowel sounds.  There is no fluid wave.  There is no palpable liver or spleen tip.  Back exam shows no tenderness over the spine, ribs or hips.  Extremities shows no clubbing, cyanosis or edema.  There is no lymphedema of the left arm.  Neurological exam shows no focal neurological deficits. @IPVITALS @   Recent Labs    08/06/17 1419  WBC 5.1  HCT 37.6  PLT 242   Recent Labs    08/06/17 1419  NA 143  K 3.5  CL 105  CO2 30  GLUCOSE 256*  BUN 7  CREATININE 0.80  CALCIUM 9.5    Blood smear review: None  Pathology:  None    Assessment and Plan: [name] [name] is a very nice 67 year old African-American female.  She has a history of locally recurrent carcinoma of the left breast.  She underwent mastectomy with reconstruction 12 years ago.  I am not sure exactly what is going on.  I think we need to get a CT scan to see what is happening.  I think this would be reasonable.  If there is something on the CT scan that looks like recurrence, we will have to get a biopsy.  Her labs look okay.  I try to reassure her.  I think this might be some kind of neuropathy.  I would be surprised if she had breast cancer recurrence.  However, this is certainly in the realm of possibility.  I spent about 45 minutes with her.  Over 50% of the time was spent face-to-face reviewing her labs and talking to her about my recommendations and ordering the CT scan.  She is in agreement.  We will get her back depending on the results of the CT scan.

## 2017-08-09 ENCOUNTER — Ambulatory Visit (HOSPITAL_COMMUNITY): Payer: Medicare HMO

## 2017-08-12 ENCOUNTER — Telehealth: Payer: Self-pay | Admitting: *Deleted

## 2017-08-12 ENCOUNTER — Ambulatory Visit (HOSPITAL_COMMUNITY)
Admission: RE | Admit: 2017-08-12 | Discharge: 2017-08-12 | Disposition: A | Discharge status: Home / Self Care | Payer: Medicare HMO | Source: Ambulatory Visit | Attending: Hematology & Oncology | Admitting: Hematology & Oncology

## 2017-08-12 DIAGNOSIS — I700 Atherosclerosis of aorta: Secondary | ICD-10-CM | POA: Insufficient documentation

## 2017-08-12 DIAGNOSIS — C50912 Malignant neoplasm of unspecified site of left female breast: Secondary | ICD-10-CM | POA: Diagnosis not present

## 2017-08-12 DIAGNOSIS — R918 Other nonspecific abnormal finding of lung field: Secondary | ICD-10-CM | POA: Insufficient documentation

## 2017-08-12 DIAGNOSIS — E279 Disorder of adrenal gland, unspecified: Secondary | ICD-10-CM | POA: Diagnosis not present

## 2017-08-12 DIAGNOSIS — R932 Abnormal findings on diagnostic imaging of liver and biliary tract: Secondary | ICD-10-CM | POA: Diagnosis not present

## 2017-08-12 MED ORDER — IOPAMIDOL (ISOVUE-300) INJECTION 61%
75.0000 mL | Freq: Once | INTRAVENOUS | Status: AC | PRN
  Administered 2017-08-12: 75 mL via INTRAVENOUS

## 2017-08-12 MED ORDER — IOPAMIDOL (ISOVUE-300) INJECTION 61%
INTRAVENOUS | Status: AC
  Filled 2017-08-12: qty 75

## 2017-08-12 NOTE — Telephone Encounter (Signed)
-----   Message from Volanda Napoleon, MD sent at 08/12/2017 11:48 AM EST ----- Call - NO cancer on the CT scan.!!  Laurey Arrow

## 2017-09-24 DIAGNOSIS — Z7984 Long term (current) use of oral hypoglycemic drugs: Secondary | ICD-10-CM | POA: Diagnosis not present

## 2017-09-24 DIAGNOSIS — E1165 Type 2 diabetes mellitus with hyperglycemia: Secondary | ICD-10-CM | POA: Diagnosis not present

## 2017-09-24 DIAGNOSIS — E7800 Pure hypercholesterolemia, unspecified: Secondary | ICD-10-CM | POA: Diagnosis not present

## 2017-11-12 DIAGNOSIS — H5203 Hypermetropia, bilateral: Secondary | ICD-10-CM | POA: Diagnosis not present

## 2018-03-10 DIAGNOSIS — R42 Dizziness and giddiness: Secondary | ICD-10-CM | POA: Diagnosis not present

## 2018-03-10 DIAGNOSIS — E1169 Type 2 diabetes mellitus with other specified complication: Secondary | ICD-10-CM | POA: Diagnosis not present

## 2018-03-10 DIAGNOSIS — Z7984 Long term (current) use of oral hypoglycemic drugs: Secondary | ICD-10-CM | POA: Diagnosis not present

## 2018-04-03 ENCOUNTER — Other Ambulatory Visit: Payer: Self-pay

## 2018-04-03 ENCOUNTER — Emergency Department (HOSPITAL_COMMUNITY)
Admission: EM | Admit: 2018-04-03 | Discharge: 2018-04-03 | Disposition: A | Discharge status: Home / Self Care | Payer: Medicare HMO | Attending: Emergency Medicine | Admitting: Emergency Medicine

## 2018-04-03 ENCOUNTER — Encounter (HOSPITAL_COMMUNITY): Payer: Self-pay | Admitting: Obstetrics and Gynecology

## 2018-04-03 DIAGNOSIS — Z79899 Other long term (current) drug therapy: Secondary | ICD-10-CM | POA: Diagnosis not present

## 2018-04-03 DIAGNOSIS — T7840XA Allergy, unspecified, initial encounter: Secondary | ICD-10-CM | POA: Diagnosis not present

## 2018-04-03 DIAGNOSIS — Y9289 Other specified places as the place of occurrence of the external cause: Secondary | ICD-10-CM | POA: Insufficient documentation

## 2018-04-03 DIAGNOSIS — W57XXXA Bitten or stung by nonvenomous insect and other nonvenomous arthropods, initial encounter: Secondary | ICD-10-CM | POA: Insufficient documentation

## 2018-04-03 DIAGNOSIS — Z7984 Long term (current) use of oral hypoglycemic drugs: Secondary | ICD-10-CM | POA: Insufficient documentation

## 2018-04-03 DIAGNOSIS — E119 Type 2 diabetes mellitus without complications: Secondary | ICD-10-CM | POA: Diagnosis not present

## 2018-04-03 DIAGNOSIS — S1086XA Insect bite of other specified part of neck, initial encounter: Secondary | ICD-10-CM | POA: Insufficient documentation

## 2018-04-03 DIAGNOSIS — Y999 Unspecified external cause status: Secondary | ICD-10-CM | POA: Diagnosis not present

## 2018-04-03 DIAGNOSIS — R42 Dizziness and giddiness: Secondary | ICD-10-CM | POA: Diagnosis not present

## 2018-04-03 DIAGNOSIS — R609 Edema, unspecified: Secondary | ICD-10-CM | POA: Diagnosis not present

## 2018-04-03 DIAGNOSIS — R0902 Hypoxemia: Secondary | ICD-10-CM | POA: Diagnosis not present

## 2018-04-03 DIAGNOSIS — Y9389 Activity, other specified: Secondary | ICD-10-CM | POA: Insufficient documentation

## 2018-04-03 DIAGNOSIS — R110 Nausea: Secondary | ICD-10-CM | POA: Diagnosis not present

## 2018-04-03 DIAGNOSIS — R202 Paresthesia of skin: Secondary | ICD-10-CM | POA: Diagnosis not present

## 2018-04-03 DIAGNOSIS — Z853 Personal history of malignant neoplasm of breast: Secondary | ICD-10-CM | POA: Insufficient documentation

## 2018-04-03 DIAGNOSIS — Z7982 Long term (current) use of aspirin: Secondary | ICD-10-CM | POA: Diagnosis not present

## 2018-04-03 MED ORDER — PREDNISONE 20 MG PO TABS: 40.0000 mg | ORAL_TABLET | Freq: Every day | ORAL | 0 refills | Status: DC

## 2018-04-03 MED ORDER — EPINEPHRINE 0.15 MG/0.15ML IJ SOAJ: 0.1500 mg | INTRAMUSCULAR | 1 refills | Status: AC | PRN

## 2018-04-03 MED ORDER — METHYLPREDNISOLONE SODIUM SUCC 125 MG IJ SOLR
125.0000 mg | Freq: Once | INTRAMUSCULAR | Status: AC
  Administered 2018-04-03: 125 mg via INTRAVENOUS
  Filled 2018-04-03: qty 2

## 2018-04-03 MED ORDER — FAMOTIDINE IN NACL 20-0.9 MG/50ML-% IV SOLN
20.0000 mg | Freq: Once | INTRAVENOUS | Status: AC
Start: 2018-04-03 — End: 2018-04-03
  Administered 2018-04-03: 20 mg via INTRAVENOUS
  Filled 2018-04-03: qty 50

## 2018-04-03 NOTE — Discharge Instructions (Signed)
Take benadryl 25mg  every 6 hours for the next 24 hrs.

## 2018-04-03 NOTE — ED Provider Notes (Signed)
[facility] DEPT Provider Note   CSN: [id number] Arrival date & time: 04/03/18  1550     History   Chief Complaint Chief Complaint  Patient presents with  . Insect Bite  . Allergic Reaction    HPI DEMAYA [name] is a 67 y.o. female.  Pt was sitting on a park bench and suddenly felt something sting her on the left side of her neck.  She states shortly after that she started feeling dizzy, nauseated, diffuse itching and feeling like she needed to clear her throat.  When EMS arrived they gave her Benadryl and Zofran.  She states she still feels like she needs to clear her throat and she still having some dizziness.  She also still feels itchy.  She denies any shortness of breath or tongue swelling.  She does have a prior history to reaction to bee stings.  Patient states prior to being stung today she was feeling her normal self and having a great day.  The history is provided by the patient.  Allergic Reaction  Presenting symptoms: difficulty swallowing and itching   Presenting symptoms comment:  Nausea and dizziness Severity:  Moderate Duration:  30 minutes Prior allergic episodes:  Insect allergies Context: insect bite/sting   Relieved by: some improvement with benadryl. Worsened by:  Nothing Ineffective treatments:  None tried   Past Medical History:  Diagnosis Date  . Diabetes mellitus without complication ([place])   . Recurrent infiltrating ductal carcinoma of breast ([place]) 06/18/2011    Patient Active Problem List   Diagnosis Date Noted  . Recurrent infiltrating ductal carcinoma of breast ([place]) 06/18/2011    Past Surgical History:  Procedure Laterality Date  . MASTECTOMY       OB History   None      Home Medications    Prior to Admission medications   Medication Sig Start Date End Date Taking? Authorizing Provider  aspirin EC 81 MG tablet Take 81 mg by mouth daily.    [provider]  cholecalciferol (VITAMIN D)  1000 UNITS tablet Take 1,000 Units by mouth 2 (two) times daily.     [provider]  metFORMIN (GLUCOPHAGE) 500 MG tablet Take 500 mg by mouth 2 (two) times daily with a meal.    [provider]  ondansetron (ZOFRAN ODT) 4 MG disintegrating tablet Take 1 tablet (4 mg total) by mouth every 8 (eight) hours as needed for nausea or vomiting. Patient not taking: Reported on 08/03/2014 04/02/14   Junius Creamer, NP    Family History No family history on file.  Social History Social History   Tobacco Use  . Smoking status: Never Smoker  . Smokeless tobacco: Never Used  . Tobacco comment: never used product  Substance Use Topics  . Alcohol use: No  . Drug use: No     Allergies   Patient has no known allergies.   Review of Systems Review of Systems  HENT: Positive for trouble swallowing.   Skin: Positive for itching.  All other systems reviewed and are negative.    Physical Exam Updated Vital Signs BP 132/71 (BP Location: Left Arm)   Pulse 85   Temp 98 F (36.7 C)   Resp 17   SpO2 98%   Physical Exam  Constitutional: She is oriented to person, place, and time. She appears well-developed and well-nourished. No distress.  HENT:  Head: Normocephalic and atraumatic.  Mouth/Throat: Oropharynx is clear and moist.  No tongue or uvula swelling  Eyes: Pupils are equal, round, and reactive to light. Conjunctivae and EOM are normal.  Neck: Normal range of motion. Neck supple.    No stridor  Cardiovascular: Normal rate, regular rhythm and intact distal pulses.  No murmur heard. Pulmonary/Chest: Effort normal and breath sounds normal. No respiratory distress. She has no wheezes. She has no rales.  Abdominal: Soft. She exhibits no distension. There is no tenderness. There is no rebound and no guarding.  Musculoskeletal: Normal range of motion. She exhibits no edema or tenderness.  Neurological: She is alert and oriented to person, place, and time.  Skin: Skin is  warm and dry. No rash noted. No erythema.  Psychiatric: She has a normal mood and affect. Her behavior is normal.  Nursing note and vitals reviewed.    ED Treatments / Results  Labs (all labs ordered are listed, but only abnormal results are displayed) Labs Reviewed - No data to display  EKG None  Radiology No results found.  Procedures Procedures (including critical care time)  Medications Ordered in ED Medications  famotidine (PEPCID) IVPB 20 mg premix (20 mg Intravenous New Bag/Given 04/03/18 1617)  methylPREDNISolone sodium succinate (SOLU-MEDROL) 125 mg/2 mL injection 125 mg (125 mg Intravenous Given 04/03/18 1617)     Initial Impression / Assessment and Plan / ED Course  I have reviewed the triage vital signs and the nursing notes.  Pertinent labs & imaging results that were available during my care of the patient were reviewed by me and considered in my medical decision making (see chart for details).     Patient presenting with symptoms most consistent with an allergic reaction after being stung by a bee.  There is no evidence of anaphylaxis at this time.  There is no airway involvement.  Patient has already received Benadryl but will give also Solu-Medrol and Pepcid.  She does not require epi at this time.  We will watch in the emergency room to ensure symptoms improve.  5:49 PM Pt is feeling much better and d/ced home.  Final Clinical Impressions(s) / ED Diagnoses   Final diagnoses:  Allergic reaction, initial encounter    ED Discharge Orders         Ordered    predniSONE (DELTASONE) 20 MG tablet  Daily     04/03/18 1751    EPINEPHrine 0.15 MG/0.15ML IJ injection  As needed     04/03/18 1751           Blanchie Dessert, MD 04/03/18 1751

## 2018-04-03 NOTE — ED Triage Notes (Signed)
Per EMS: Pt is coming from a park bench where she was reportedly stung by a bee on the left side of her neck.  Pt reported to have clear lung sounds and localized swelling around the area. Pt reported nausea and was give 4of zofran and 50 mg of benadryl IV.  Pt reported that the nausea has subsided

## 2018-04-03 NOTE — ED Notes (Signed)
Bed: [facility] Expected date:  Expected time:  Means of arrival:  Comments: Insect sting

## 2018-04-12 DIAGNOSIS — T39395A Adverse effect of other nonsteroidal anti-inflammatory drugs [NSAID], initial encounter: Secondary | ICD-10-CM | POA: Diagnosis not present

## 2018-04-12 DIAGNOSIS — K2960 Other gastritis without bleeding: Secondary | ICD-10-CM | POA: Diagnosis not present

## 2018-05-23 DIAGNOSIS — Z23 Encounter for immunization: Secondary | ICD-10-CM | POA: Diagnosis not present

## 2018-05-23 DIAGNOSIS — E7800 Pure hypercholesterolemia, unspecified: Secondary | ICD-10-CM | POA: Diagnosis not present

## 2018-05-23 DIAGNOSIS — E1169 Type 2 diabetes mellitus with other specified complication: Secondary | ICD-10-CM | POA: Diagnosis not present

## 2018-05-23 DIAGNOSIS — Z1159 Encounter for screening for other viral diseases: Secondary | ICD-10-CM | POA: Diagnosis not present

## 2018-05-23 DIAGNOSIS — Z1389 Encounter for screening for other disorder: Secondary | ICD-10-CM | POA: Diagnosis not present

## 2018-05-23 DIAGNOSIS — I10 Essential (primary) hypertension: Secondary | ICD-10-CM | POA: Diagnosis not present

## 2018-05-23 DIAGNOSIS — Z853 Personal history of malignant neoplasm of breast: Secondary | ICD-10-CM | POA: Diagnosis not present

## 2018-05-23 DIAGNOSIS — Z0000 Encounter for general adult medical examination without abnormal findings: Secondary | ICD-10-CM | POA: Diagnosis not present

## 2018-06-13 DIAGNOSIS — E1169 Type 2 diabetes mellitus with other specified complication: Secondary | ICD-10-CM | POA: Diagnosis not present

## 2018-06-13 DIAGNOSIS — Z1159 Encounter for screening for other viral diseases: Secondary | ICD-10-CM | POA: Diagnosis not present

## 2018-06-28 DIAGNOSIS — J069 Acute upper respiratory infection, unspecified: Secondary | ICD-10-CM | POA: Diagnosis not present

## 2018-06-28 DIAGNOSIS — M791 Myalgia, unspecified site: Secondary | ICD-10-CM | POA: Diagnosis not present

## 2018-06-28 DIAGNOSIS — R51 Headache: Secondary | ICD-10-CM | POA: Diagnosis not present

## 2018-08-16 DIAGNOSIS — J029 Acute pharyngitis, unspecified: Secondary | ICD-10-CM | POA: Diagnosis not present

## 2018-08-16 DIAGNOSIS — B349 Viral infection, unspecified: Secondary | ICD-10-CM | POA: Diagnosis not present

## 2018-11-22 DIAGNOSIS — E1169 Type 2 diabetes mellitus with other specified complication: Secondary | ICD-10-CM | POA: Diagnosis not present

## 2018-11-22 DIAGNOSIS — E7800 Pure hypercholesterolemia, unspecified: Secondary | ICD-10-CM | POA: Diagnosis not present

## 2018-11-28 DIAGNOSIS — E1169 Type 2 diabetes mellitus with other specified complication: Secondary | ICD-10-CM | POA: Diagnosis not present

## 2018-11-28 DIAGNOSIS — E1165 Type 2 diabetes mellitus with hyperglycemia: Secondary | ICD-10-CM | POA: Diagnosis not present

## 2019-02-01 ENCOUNTER — Other Ambulatory Visit: Payer: Self-pay

## 2019-02-01 DIAGNOSIS — Z20822 Contact with and (suspected) exposure to covid-19: Secondary | ICD-10-CM

## 2019-02-02 LAB — NOVEL CORONAVIRUS, NAA: SARS-CoV-2, NAA: NOT DETECTED

## 2019-03-29 DIAGNOSIS — R110 Nausea: Secondary | ICD-10-CM | POA: Diagnosis not present

## 2019-03-29 DIAGNOSIS — R51 Headache: Secondary | ICD-10-CM | POA: Diagnosis not present

## 2019-04-06 DIAGNOSIS — R1030 Lower abdominal pain, unspecified: Secondary | ICD-10-CM | POA: Diagnosis not present

## 2019-04-06 DIAGNOSIS — N898 Other specified noninflammatory disorders of vagina: Secondary | ICD-10-CM | POA: Diagnosis not present

## 2019-04-06 DIAGNOSIS — L298 Other pruritus: Secondary | ICD-10-CM | POA: Diagnosis not present

## 2019-06-18 ENCOUNTER — Other Ambulatory Visit: Payer: Self-pay

## 2019-06-18 ENCOUNTER — Emergency Department (HOSPITAL_COMMUNITY): Payer: Medicare HMO

## 2019-06-18 ENCOUNTER — Inpatient Hospital Stay (HOSPITAL_COMMUNITY)
Admission: EM | Admit: 2019-06-18 | Discharge: 2019-06-20 | DRG: 287 | Disposition: A | Discharge status: Home / Self Care | Payer: Medicare HMO | Attending: Cardiovascular Disease | Admitting: Cardiovascular Disease

## 2019-06-18 ENCOUNTER — Encounter (HOSPITAL_COMMUNITY): Payer: Self-pay | Admitting: Emergency Medicine

## 2019-06-18 DIAGNOSIS — Z20828 Contact with and (suspected) exposure to other viral communicable diseases: Secondary | ICD-10-CM | POA: Diagnosis present

## 2019-06-18 DIAGNOSIS — Z7982 Long term (current) use of aspirin: Secondary | ICD-10-CM

## 2019-06-18 DIAGNOSIS — Z9012 Acquired absence of left breast and nipple: Secondary | ICD-10-CM

## 2019-06-18 DIAGNOSIS — R072 Precordial pain: Secondary | ICD-10-CM

## 2019-06-18 DIAGNOSIS — I249 Acute ischemic heart disease, unspecified: Principal | ICD-10-CM | POA: Diagnosis present

## 2019-06-18 DIAGNOSIS — K219 Gastro-esophageal reflux disease without esophagitis: Secondary | ICD-10-CM | POA: Diagnosis not present

## 2019-06-18 DIAGNOSIS — Z7952 Long term (current) use of systemic steroids: Secondary | ICD-10-CM

## 2019-06-18 DIAGNOSIS — R0902 Hypoxemia: Secondary | ICD-10-CM | POA: Diagnosis not present

## 2019-06-18 DIAGNOSIS — R0789 Other chest pain: Secondary | ICD-10-CM | POA: Diagnosis not present

## 2019-06-18 DIAGNOSIS — Z853 Personal history of malignant neoplasm of breast: Secondary | ICD-10-CM

## 2019-06-18 DIAGNOSIS — Z923 Personal history of irradiation: Secondary | ICD-10-CM | POA: Diagnosis not present

## 2019-06-18 DIAGNOSIS — R079 Chest pain, unspecified: Secondary | ICD-10-CM | POA: Diagnosis not present

## 2019-06-18 DIAGNOSIS — E785 Hyperlipidemia, unspecified: Secondary | ICD-10-CM | POA: Diagnosis not present

## 2019-06-18 DIAGNOSIS — Z9221 Personal history of antineoplastic chemotherapy: Secondary | ICD-10-CM

## 2019-06-18 DIAGNOSIS — E1165 Type 2 diabetes mellitus with hyperglycemia: Secondary | ICD-10-CM | POA: Diagnosis not present

## 2019-06-18 DIAGNOSIS — Z23 Encounter for immunization: Secondary | ICD-10-CM | POA: Diagnosis not present

## 2019-06-18 DIAGNOSIS — I200 Unstable angina: Secondary | ICD-10-CM | POA: Diagnosis not present

## 2019-06-18 DIAGNOSIS — Z79899 Other long term (current) drug therapy: Secondary | ICD-10-CM | POA: Diagnosis not present

## 2019-06-18 DIAGNOSIS — I959 Hypotension, unspecified: Secondary | ICD-10-CM | POA: Diagnosis not present

## 2019-06-18 DIAGNOSIS — R001 Bradycardia, unspecified: Secondary | ICD-10-CM | POA: Diagnosis not present

## 2019-06-18 DIAGNOSIS — Z7984 Long term (current) use of oral hypoglycemic drugs: Secondary | ICD-10-CM

## 2019-06-18 DIAGNOSIS — E119 Type 2 diabetes mellitus without complications: Secondary | ICD-10-CM | POA: Diagnosis not present

## 2019-06-18 LAB — HEPARIN LEVEL (UNFRACTIONATED): Heparin Unfractionated: 0.72 IU/mL — ABNORMAL HIGH (ref 0.30–0.70)

## 2019-06-18 LAB — COMPREHENSIVE METABOLIC PANEL
ALT: 35 U/L (ref 0–44)
AST: 33 U/L (ref 15–41)
Albumin: 3.6 g/dL (ref 3.5–5.0)
Alkaline Phosphatase: 86 U/L (ref 38–126)
Anion gap: 9 (ref 5–15)
BUN: 14 mg/dL (ref 8–23)
CO2: 26 mmol/L (ref 22–32)
Calcium: 9.5 mg/dL (ref 8.9–10.3)
Chloride: 104 mmol/L (ref 98–111)
Creatinine, Ser: 0.95 mg/dL (ref 0.44–1.00)
GFR calc Af Amer: >60 mL/min (ref >60)
GFR calc non Af Amer: >60 mL/min (ref >60)
Glucose, Bld: 211 mg/dL — ABNORMAL HIGH (ref 70–99)
Potassium: 4.8 mmol/L (ref 3.5–5.1)
Sodium: 139 mmol/L (ref 135–145)
Total Bilirubin: 0.8 mg/dL (ref 0.3–1.2)
Total Protein: 6.5 g/dL (ref 6.5–8.1)

## 2019-06-18 LAB — CBC WITH DIFFERENTIAL/PLATELET
Abs Immature Granulocytes: 0.01 10*3/uL (ref 0.00–0.07)
Basophils Absolute: 0.1 10*3/uL (ref 0.0–0.1)
Basophils Relative: 1 %
Eosinophils Absolute: 0.1 10*3/uL (ref 0.0–0.5)
Eosinophils Relative: 1 %
HCT: 39 % (ref 36.0–46.0)
Hemoglobin: 13 g/dL (ref 12.0–15.0)
Immature Granulocytes: 0 %
Lymphocytes Relative: 34 %
Lymphs Abs: 1.5 10*3/uL (ref 0.7–4.0)
MCH: 31.1 pg (ref 26.0–34.0)
MCHC: 33.3 g/dL (ref 30.0–36.0)
MCV: 93.3 fL (ref 80.0–100.0)
Monocytes Absolute: 0.5 10*3/uL (ref 0.1–1.0)
Monocytes Relative: 10 %
Neutro Abs: 2.4 10*3/uL (ref 1.7–7.7)
Neutrophils Relative %: 54 %
Platelets: 277 10*3/uL (ref 150–400)
RBC: 4.18 MIL/uL (ref 3.87–5.11)
RDW: 14.1 % (ref 11.5–15.5)
WBC: 4.5 10*3/uL (ref 4.0–10.5)
nRBC: 0 % (ref 0.0–0.2)

## 2019-06-18 LAB — HIV ANTIBODY (ROUTINE TESTING W REFLEX): HIV Screen 4th Generation wRfx: NONREACTIVE

## 2019-06-18 LAB — TROPONIN I (HIGH SENSITIVITY)
Troponin I (High Sensitivity): <2 ng/L (ref <18)
Troponin I (High Sensitivity): <2 ng/L (ref <18)

## 2019-06-18 LAB — SARS CORONAVIRUS 2 (TAT 6-24 HRS): SARS Coronavirus 2: NEGATIVE

## 2019-06-18 LAB — HEMOGLOBIN A1C
Hgb A1c MFr Bld: 7.8 % — ABNORMAL HIGH (ref 4.8–5.6)
Mean Plasma Glucose: 177.16 mg/dL

## 2019-06-18 LAB — GLUCOSE, CAPILLARY
Glucose-Capillary: 85 mg/dL (ref 70–99)
Glucose-Capillary: 157 mg/dL — ABNORMAL HIGH (ref 70–99)

## 2019-06-18 MED ORDER — LOSARTAN POTASSIUM 25 MG PO TABS
25.0000 mg | ORAL_TABLET | Freq: Every day | ORAL | Status: DC
  Administered 2019-06-18 – 2019-06-20 (×3): 25 mg via ORAL
  Filled 2019-06-18 (×3): qty 1

## 2019-06-18 MED ORDER — METOPROLOL TARTRATE 12.5 MG HALF TABLET
12.5000 mg | ORAL_TABLET | Freq: Two times a day (BID) | ORAL | Status: DC
  Administered 2019-06-18 – 2019-06-20 (×4): 12.5 mg via ORAL
  Filled 2019-06-18 (×4): qty 1

## 2019-06-18 MED ORDER — NITROGLYCERIN 0.4 MG SL SUBL
0.4000 mg | SUBLINGUAL_TABLET | SUBLINGUAL | Status: DC | PRN
  Administered 2019-06-18: 0.4 mg via SUBLINGUAL
  Filled 2019-06-18: qty 1

## 2019-06-18 MED ORDER — ALUM & MAG HYDROXIDE-SIMETH 200-200-20 MG/5ML PO SUSP
15.0000 mL | Freq: Once | ORAL | Status: AC
  Administered 2019-06-18: 15 mL via ORAL
  Filled 2019-06-18: qty 30

## 2019-06-18 MED ORDER — HEPARIN (PORCINE) 25000 UT/250ML-% IV SOLN
800.0000 [IU]/h | INTRAVENOUS | Status: DC
  Administered 2019-06-18: 850 [IU]/h via INTRAVENOUS
  Filled 2019-06-18: qty 250

## 2019-06-18 MED ORDER — MORPHINE SULFATE (PF) 2 MG/ML IV SOLN: 1.0000 mg | INTRAVENOUS | Status: DC | PRN

## 2019-06-18 MED ORDER — ASPIRIN 81 MG PO CHEW
324.0000 mg | CHEWABLE_TABLET | ORAL | Status: AC
  Filled 2019-06-18: qty 4

## 2019-06-18 MED ORDER — HEPARIN BOLUS VIA INFUSION
4000.0000 [IU] | Freq: Once | INTRAVENOUS | Status: AC
  Administered 2019-06-18: 4000 [IU] via INTRAVENOUS
  Filled 2019-06-18: qty 4000

## 2019-06-18 MED ORDER — ASPIRIN EC 81 MG PO TBEC
81.0000 mg | DELAYED_RELEASE_TABLET | Freq: Every day | ORAL | Status: DC
  Administered 2019-06-20: 81 mg via ORAL
  Filled 2019-06-18: qty 1

## 2019-06-18 MED ORDER — PANTOPRAZOLE SODIUM 40 MG PO TBEC
40.0000 mg | DELAYED_RELEASE_TABLET | Freq: Every day | ORAL | Status: DC
  Administered 2019-06-18 – 2019-06-20 (×3): 40 mg via ORAL
  Filled 2019-06-18 (×3): qty 1

## 2019-06-18 MED ORDER — ATORVASTATIN CALCIUM 40 MG PO TABS
40.0000 mg | ORAL_TABLET | Freq: Every day | ORAL | Status: DC
  Administered 2019-06-18 – 2019-06-19 (×2): 40 mg via ORAL
  Filled 2019-06-18 (×2): qty 1

## 2019-06-18 MED ORDER — ACETAMINOPHEN 325 MG PO TABS
650.0000 mg | ORAL_TABLET | ORAL | Status: DC | PRN
  Administered 2019-06-19: 22:00:00 650 mg via ORAL
  Filled 2019-06-18: qty 2

## 2019-06-18 MED ORDER — ONDANSETRON HCL 4 MG/2ML IJ SOLN: 4.0000 mg | Freq: Four times a day (QID) | INTRAMUSCULAR | Status: DC | PRN

## 2019-06-18 MED ORDER — ASPIRIN 300 MG RE SUPP: 300.0000 mg | RECTAL | Status: AC

## 2019-06-18 MED ORDER — PNEUMOCOCCAL VAC POLYVALENT 25 MCG/0.5ML IJ INJ
0.5000 mL | INJECTION | INTRAMUSCULAR | Status: AC
  Administered 2019-06-19: 0.5 mL via INTRAMUSCULAR
  Filled 2019-06-18: qty 0.5

## 2019-06-18 MED ORDER — INSULIN ASPART 100 UNIT/ML ~~LOC~~ SOLN
0.0000 [IU] | Freq: Three times a day (TID) | SUBCUTANEOUS | Status: DC
  Administered 2019-06-19 (×2): 3 [IU] via SUBCUTANEOUS
  Administered 2019-06-20: 2 [IU] via SUBCUTANEOUS

## 2019-06-18 NOTE — ED Provider Notes (Signed)
[facility] EMERGENCY DEPARTMENT Provider Note   CSN: [id number] Arrival date & time: 06/18/19  1224     History Chief Complaint  Patient presents with  . Chest Pain    [name] [name] is a 68 y.o. female.  The history is provided by the patient, medical records and the EMS personnel. No language interpreter was used.  Chest Pain  [name] [name] is a 68 y.o. female who presents to the Emergency Department complaining of chest pain. She presents the emergency department complaining of central chest pain described as tightness and burning that began yesterday. Symptoms were constant in nature. She has associated nausea and indigestion sensation. She denies any fevers, cough, shortness of breath, abdominal pain, vomiting, diarrhea, leg swelling or pain, diaphoresis, loss of taste or smell. No known COVID 19 exposures. No prior similar symptoms. She received aspirin 324 mg as well as nitroglycerin times two by EMS. She states that her pain is significantly improved to resolved following the nitroglycerin administration. She has a history of diabetes, hyperlipidemia. She is a non-smoker, no alcohol, no drug use. No family history of cardiac disease.    Past Medical History:  Diagnosis Date  . Diabetes mellitus without complication ([place])   . Recurrent infiltrating ductal carcinoma of breast ([place]) 06/18/2011    Patient Active Problem List   Diagnosis Date Noted  . Recurrent infiltrating ductal carcinoma of breast ([place]) 06/18/2011    Past Surgical History:  Procedure Laterality Date  . MASTECTOMY       OB History   No obstetric history on file.     History reviewed. No pertinent family history.  Social History   Tobacco Use  . Smoking status: Never Smoker  . Smokeless tobacco: Never Used  . Tobacco comment: never used product  Substance Use Topics  . Alcohol use: No  . Drug use: No    Home Medications Prior to Admission medications     Medication Sig Start Date End Date Taking? Authorizing Provider  aspirin EC 81 MG tablet Take 81 mg by mouth daily.    [provider]  cholecalciferol (VITAMIN D) 1000 UNITS tablet Take 1,000 Units by mouth 2 (two) times daily.     [provider]  EPINEPHrine 0.15 MG/0.15ML IJ injection Inject 0.15 mLs (0.15 mg total) into the muscle as needed for anaphylaxis. 04/03/18   Blanchie Dessert, MD  metFORMIN (GLUCOPHAGE) 500 MG tablet Take 500 mg by mouth 2 (two) times daily with a meal.    [provider]  ondansetron (ZOFRAN ODT) 4 MG disintegrating tablet Take 1 tablet (4 mg total) by mouth every 8 (eight) hours as needed for nausea or vomiting. Patient not taking: Reported on 08/03/2014 04/02/14   Junius Creamer, NP  predniSONE (DELTASONE) 20 MG tablet Take 2 tablets (40 mg total) by mouth daily. 04/03/18   Blanchie Dessert, MD    Allergies    Patient has no known allergies.  Review of Systems   Review of Systems  Cardiovascular: Positive for chest pain.  All other systems reviewed and are negative.   Physical Exam Updated Vital Signs BP 118/66 (BP Location: Right Arm)   Pulse 77   Temp 98.9 F (37.2 C) (Oral)   Resp 15   Ht 5\' 4"  (1.626 m)   Wt 68.5 kg   SpO2 96%   BMI 25.92 kg/m   Physical Exam Vitals and nursing note reviewed.  Constitutional:      Appearance: She is well-developed.  HENT:     Head: Normocephalic and atraumatic.  Cardiovascular:     Rate and Rhythm: Normal rate and regular rhythm.     Heart sounds: No murmur.  Pulmonary:     Effort: Pulmonary effort is normal. No respiratory distress.     Breath sounds: Normal breath sounds.  Abdominal:     Palpations: Abdomen is soft.     Tenderness: There is no abdominal tenderness. There is no guarding or rebound.  Musculoskeletal:        General: No swelling or tenderness.  Skin:    General: Skin is warm and dry.  Neurological:     Mental Status: She is alert and oriented to person,  place, and time.  Psychiatric:        Behavior: Behavior normal.     ED Results / Procedures / Treatments   Labs (all labs ordered are listed, but only abnormal results are displayed) Labs Reviewed  COMPREHENSIVE METABOLIC PANEL - Abnormal; Notable for the following components:      Result Value   Glucose, Bld 211 (*)    All other components within normal limits  SARS CORONAVIRUS 2 (TAT 6-24 HRS)  CBC WITH DIFFERENTIAL/PLATELET  TROPONIN I (HIGH SENSITIVITY)  TROPONIN I (HIGH SENSITIVITY)    EKG EKG Interpretation  Date/Time:  Sunday June 18 2019 12:50:22 EST Ventricular Rate:  76 PR Interval:  182 QRS Duration: 78 QT Interval:  402 QTC Calculation: 452 R Axis:   -37 Text Interpretation: Normal sinus rhythm Left axis deviation Low voltage QRS Nonspecific T wave abnormality Abnormal ECG Confirmed by Quintella Reichert [phone]) on 06/18/2019 1:09:18 PM   Radiology DG Chest Port 1 View  Result Date: 06/18/2019 CLINICAL DATA:  Substernal chest tightness. EXAM: PORTABLE CHEST 1 VIEW COMPARISON:  March 17, 2016 FINDINGS: No pneumothorax. The cardiomediastinal silhouette is stable. The right lung is clear. Haziness projects over the periphery of the left lower lung, likely due to overlapping soft tissues secondary to patient rotation. No suspicious infiltrates are noted. IMPRESSION: Haziness over the left base is favored to be due to overlapping soft tissues. No acute abnormalities are identified. Electronically Signed   By: Dorise Bullion III M.D   On: 06/18/2019 13:24    Procedures Procedures (including critical care time)  Medications Ordered in ED Medications  alum & mag hydroxide-simeth (MAALOX/MYLANTA) 200-200-20 MG/5ML suspension 15 mL (15 mLs Oral Given 06/18/19 1441)    ED Course  I have reviewed the triage vital signs and the nursing notes.  Pertinent labs & imaging results that were available during my care of the patient were reviewed by me and considered  in my medical decision making (see chart for details).    MDM Rules/Calculators/A&P                      Patient here for evaluation of chest pain. EKG with nonspecific changes in initial troponin is negative. History is concerning for potential cardiac cause of her pain. Presentation is not consistent with PE, pneumonia. Discussed with patient findings of studies recommendation for admission and she is in agreement plan. Discussed with Dr. Doylene Canard with Cardiology, will see the patient in consult.   Final Clinical Impression(s) / ED Diagnoses Final diagnoses:  Precordial pain    Rx / DC Orders ED Discharge Orders    None       Quintella Reichert, MD 06/18/19 1525

## 2019-06-18 NOTE — Progress Notes (Signed)
[facility] for Heparin Indication: chest pain/ACS  Heparin Dosing Weight: 68.4 kg  Labs: Recent Labs    06/18/19 1230 06/18/19 1445 06/18/19 2301  HGB 13.0  --   --   HCT 39.0  --   --   PLT 277  --   --   HEPARINUNFRC  --   --  0.72*  CREATININE 0.95  --   --   TROPONINIHS <2 <2  --     Estimated Creatinine Clearance: 53.1 mL/min (by C-G formula based on SCr of 0.95 mg/dL).  Assessment: 89 yof presenting with CP, elevated high-sensitivity troponin. Pharmacy consulted to dose heparin. Patient is not on anticoagulation PTA. CBC wnl. No active bleed issues documented  12/20 PM update:  Heparin level elevated  No issues per RN  Goal of Therapy:  Heparin level 0.3-0.7 units/ml Monitor platelets by anticoagulation protocol: Yes   Plan:  Dec heparin to 800 units/hr Re-check heparin level with AM labs  Narda Bonds, PharmD, [place] Pharmacist Phone: [phone]

## 2019-06-18 NOTE — ED Triage Notes (Signed)
Pt arrives to ED from [facility] with complaints of substernal chest tightness since yesterday 12/19 at 8pm. Patient received x2 nitro and 324 ASA from EMS with slight improvement in pain.

## 2019-06-18 NOTE — Progress Notes (Signed)
[facility] for heparin Indication: chest pain/ACS  Heparin Dosing Weight: 68.4 kg  Labs: Recent Labs    06/18/19 1230 06/18/19 1445  HGB 13.0  --   HCT 39.0  --   PLT 277  --   CREATININE 0.95  --   TROPONINIHS <2 <2    Estimated Creatinine Clearance: 53.1 mL/min (by C-G formula based on SCr of 0.95 mg/dL).  Assessment: 79 yof presenting with CP, elevated high-sensitivity troponin. Pharmacy consulted to dose heparin. Patient is not on anticoagulation PTA. CBC wnl. No active bleed issues documented  Goal of Therapy:  Heparin level 0.3-0.7 units/ml Monitor platelets by anticoagulation protocol: Yes   Plan:  Heparin 4000 unit bolus Start heparin at 850 units/h 6h heparin level Daily heparin level/CBC Monitor s/sx bleeding   Elicia Lamp, PharmD, BCPS Clinical Pharmacist 05/21/2019 8:49 PM

## 2019-06-18 NOTE — H&P (Signed)
Referring Physician: ED  [name] [name] is an 68 y.o. female.                       Chief Complaint: Chest pressure  HPI: 68 years old black female with PMH of type 2 DM and Ductal carcinoma of breast diagnosed in 1999, treated with radiation therapy, chemotherapy and left mastectomy in 2006 has no recent recurrence of cancer. She has chest pressure since last evening with slight improvement with aspirin and SL NTG. She denies cough, fever or radiation of discomfort but has felt shortness of breath with activity for 1 week. Her EKG shows non-specific T waves changes in anterior leads. Chest x-ray  Is without acute abnormality. 1st. HS-Troponin level is normal. CBC and BMET are normal except hyperglycemia.  Past Medical History:  Diagnosis Date  . Diabetes mellitus without complication ([place])   . Recurrent infiltrating ductal carcinoma of breast ([place]) 06/18/2011      Past Surgical History:  Procedure Laterality Date  . MASTECTOMY      History reviewed. No pertinent family history. Social History:  reports that she has never smoked. She has never used smokeless tobacco. She reports that she does not drink alcohol or use drugs.  Allergies: No Known Allergies  (Not in a hospital admission)   Results for orders placed or performed during the hospital encounter of 06/18/19 (from the past 48 hour(s))  Comprehensive metabolic panel     Status: Abnormal   Collection Time: 06/18/19 12:30 PM  Result Value Ref Range   Sodium 139 135 - 145 mmol/L   Potassium 4.8 3.5 - 5.1 mmol/L   Chloride 104 98 - 111 mmol/L   CO2 26 22 - 32 mmol/L   Glucose, Bld 211 (H) 70 - 99 mg/dL   BUN 14 8 - 23 mg/dL   Creatinine, Ser 0.95 0.44 - 1.00 mg/dL   Calcium 9.5 8.9 - 10.3 mg/dL   Total Protein 6.5 6.5 - 8.1 g/dL   Albumin 3.6 3.5 - 5.0 g/dL   AST 33 15 - 41 U/L   ALT 35 0 - 44 U/L   Alkaline Phosphatase 86 38 - 126 U/L   Total Bilirubin 0.8 0.3 - 1.2 mg/dL   GFR calc non Af Amer >60 >60 mL/min   GFR calc Af Amer >60 >60 mL/min   Anion gap 9 5 - 15    Comment: Performed at [facility], [address] [place],  [address]  CBC with Differential     Status: None   Collection Time: 06/18/19 12:30 PM  Result Value Ref Range   WBC 4.5 4.0 - 10.5 K/uL   RBC 4.18 3.87 - 5.11 MIL/uL   Hemoglobin 13.0 12.0 - 15.0 g/dL   HCT 39.0 36.0 - 46.0 %   MCV 93.3 80.0 - 100.0 fL   MCH 31.1 26.0 - 34.0 pg   MCHC 33.3 30.0 - 36.0 g/dL   RDW 14.1 11.5 - 15.5 %   Platelets 277 150 - 400 K/uL   nRBC 0.0 0.0 - 0.2 %   Neutrophils Relative % 54 %   Neutro Abs 2.4 1.7 - 7.7 K/uL   Lymphocytes Relative 34 %   Lymphs Abs 1.5 0.7 - 4.0 K/uL   Monocytes Relative 10 %   Monocytes Absolute 0.5 0.1 - 1.0 K/uL   Eosinophils Relative 1 %   Eosinophils Absolute 0.1 0.0 - 0.5 K/uL   Basophils Relative 1 %  Basophils Absolute 0.1 0.0 - 0.1 K/uL   Immature Granulocytes 0 %   Abs Immature Granulocytes 0.01 0.00 - 0.07 K/uL    Comment: Performed at [facility], [place] [address] [place], [place] [address]  Troponin I (High Sensitivity)     Status: None   Collection Time: 06/18/19 12:30 PM  Result Value Ref Range   Troponin I (High Sensitivity) <2 <18 ng/L    Comment: Performed at [facility] [address] [place],  [address]   DG Chest Port 1 View  Result Date: 06/18/2019 CLINICAL DATA:  Substernal chest tightness. EXAM: PORTABLE CHEST 1 VIEW COMPARISON:  March 17, 2016 FINDINGS: No pneumothorax. The cardiomediastinal silhouette is stable. The right lung is clear. Haziness projects over the periphery of the left lower lung, likely due to overlapping soft tissues secondary to patient rotation. No suspicious infiltrates are noted. IMPRESSION: Haziness over the left base is favored to be due to overlapping soft tissues. No acute abnormalities are identified. Electronically Signed   By: Dorise Bullion III M.D   On: 06/18/2019 13:24    Review Of Systems Constitutional: No  fever, chills, weight loss or gain. Eyes: No vision change, wears glasses. No discharge or pain. Ears: No hearing loss, No tinnitus. Respiratory: No asthma, COPD, pneumonias. No shortness of breath. No hemoptysis. Breast: Left breast mastectomy for recurrent ductal carcinoma Cardiovascular: Positive chest pain, palpitation, leg edema. Gastrointestinal: No nausea, vomiting, diarrhea, constipation. No GI bleed. No hepatitis. Genitourinary: No dysuria, hematuria, kidney stone. No incontinance. Neurological: No headache, stroke, seizures.  Psychiatry: No psych facility admission for anxiety, depression, suicide. No detox. Skin: No rash. Musculoskeletal: No joint pain, fibromyalgia. No neck pain, back pain. Lymphadenopathy: No lymphadenopathy. Hematology: No anemia or easy bruising. Endocrine: Positive type 2 DM.   Blood pressure 118/66, pulse 77, temperature 98.9 F (37.2 C), temperature source Oral, resp. rate 15, height 5\' 4"  (1.626 m), weight 68.5 kg, SpO2 96 %. Body mass index is 25.92 kg/m. General appearance: alert, cooperative, appears stated age and no distress Head: Normocephalic, atraumatic. Eyes: Brown eyes, pink conjunctiva, corneas clear. No scleral icterus. EOM's intact. Neck: No adenopathy, no carotid bruit, no JVD, supple, symmetrical, trachea midline and thyroid not enlarged. Resp: Clear to auscultation bilaterally. Cardio: Regular rate and rhythm, S1, S2 normal, II/VI systolic murmur, no click, rub or gallop GI: Soft, non-tender; bowel sounds normal; no organomegaly. Extremities: No edema, cyanosis or clubbing. Skin: Warm and dry.  Neurologic: Alert and oriented X 3, normal strength. Normal coordination and gait.  Assessment/Plan Acute coronary syndrome Type 2 DM S/P left breast mastectomy for ductal carcinoma  Admit. IV heparin/NTG/Aspirin/Morphine. R/O MI. Sliding scale insulin and hold metformin. Consider cardiac cath in AM if chest pain persist or Troponin  levels are high. Patient understood risks, alternatives and agrees with plan.  Time spent: Review of old records, Lab, x-rays, EKG, other cardiac tests, examination, discussion with patient over 70 minutes.  Birdie Riddle, MD  06/18/2019, 3:35 PM

## 2019-06-19 ENCOUNTER — Inpatient Hospital Stay (HOSPITAL_COMMUNITY): Payer: Medicare HMO

## 2019-06-19 ENCOUNTER — Encounter (HOSPITAL_COMMUNITY)
Admission: EM | Disposition: A | Discharge status: Home / Self Care | Payer: Self-pay | Source: Home / Self Care | Attending: Cardiovascular Disease

## 2019-06-19 ENCOUNTER — Other Ambulatory Visit: Payer: Self-pay

## 2019-06-19 HISTORY — PX: LEFT HEART CATH AND CORONARY ANGIOGRAPHY: CATH118249

## 2019-06-19 LAB — GLUCOSE, CAPILLARY
Glucose-Capillary: 129 mg/dL — ABNORMAL HIGH (ref 70–99)
Glucose-Capillary: 119 mg/dL — ABNORMAL HIGH (ref 70–99)
Glucose-Capillary: 196 mg/dL — ABNORMAL HIGH (ref 70–99)
Glucose-Capillary: 168 mg/dL — ABNORMAL HIGH (ref 70–99)
Glucose-Capillary: 125 mg/dL — ABNORMAL HIGH (ref 70–99)

## 2019-06-19 LAB — POCT ACTIVATED CLOTTING TIME: Activated Clotting Time: 114 seconds

## 2019-06-19 LAB — CBC
HCT: 38.7 % (ref 36.0–46.0)
Hemoglobin: 12.6 g/dL (ref 12.0–15.0)
MCH: 30.3 pg (ref 26.0–34.0)
MCHC: 32.6 g/dL (ref 30.0–36.0)
MCV: 93 fL (ref 80.0–100.0)
Platelets: 270 10*3/uL (ref 150–400)
RBC: 4.16 MIL/uL (ref 3.87–5.11)
RDW: 14.2 % (ref 11.5–15.5)
WBC: 4.8 10*3/uL (ref 4.0–10.5)
nRBC: 0 % (ref 0.0–0.2)

## 2019-06-19 LAB — BASIC METABOLIC PANEL
Anion gap: 10 (ref 5–15)
BUN: 10 mg/dL (ref 8–23)
CO2: 26 mmol/L (ref 22–32)
Calcium: 9.2 mg/dL (ref 8.9–10.3)
Chloride: 106 mmol/L (ref 98–111)
Creatinine, Ser: 0.81 mg/dL (ref 0.44–1.00)
GFR calc Af Amer: >60 mL/min (ref >60)
GFR calc non Af Amer: >60 mL/min (ref >60)
Glucose, Bld: 141 mg/dL — ABNORMAL HIGH (ref 70–99)
Potassium: 4 mmol/L (ref 3.5–5.1)
Sodium: 142 mmol/L (ref 135–145)

## 2019-06-19 LAB — PROTIME-INR
INR: 1 (ref 0.8–1.2)
Prothrombin Time: 13 seconds (ref 11.4–15.2)

## 2019-06-19 LAB — HEPARIN LEVEL (UNFRACTIONATED): Heparin Unfractionated: 0.67 IU/mL (ref 0.30–0.70)

## 2019-06-19 SURGERY — LEFT HEART CATH AND CORONARY ANGIOGRAPHY
Anesthesia: LOCAL

## 2019-06-19 MED ORDER — MIDAZOLAM HCL 2 MG/2ML IJ SOLN
INTRAMUSCULAR | Status: DC | PRN
  Administered 2019-06-19: 1 mg via INTRAVENOUS

## 2019-06-19 MED ORDER — FENTANYL CITRATE (PF) 100 MCG/2ML IJ SOLN
INTRAMUSCULAR | Status: AC
  Filled 2019-06-19: qty 2

## 2019-06-19 MED ORDER — SODIUM CHLORIDE 0.9% FLUSH
3.0000 mL | Freq: Two times a day (BID) | INTRAVENOUS | Status: DC
  Administered 2019-06-19 – 2019-06-20 (×2): 3 mL via INTRAVENOUS

## 2019-06-19 MED ORDER — SODIUM CHLORIDE 0.9 % IV SOLN: 250.0000 mL | INTRAVENOUS | Status: DC | PRN

## 2019-06-19 MED ORDER — LIDOCAINE HCL (PF) 1 % IJ SOLN
INTRAMUSCULAR | Status: DC | PRN
  Administered 2019-06-19: 30 mL

## 2019-06-19 MED ORDER — ASPIRIN 81 MG PO CHEW
81.0000 mg | CHEWABLE_TABLET | ORAL | Status: AC
  Administered 2019-06-19: 81 mg via ORAL

## 2019-06-19 MED ORDER — SODIUM CHLORIDE 0.9% FLUSH: 3.0000 mL | Freq: Two times a day (BID) | INTRAVENOUS | Status: DC

## 2019-06-19 MED ORDER — LABETALOL HCL 5 MG/ML IV SOLN: 10.0000 mg | INTRAVENOUS | Status: AC | PRN

## 2019-06-19 MED ORDER — MIDAZOLAM HCL 2 MG/2ML IJ SOLN
INTRAMUSCULAR | Status: AC
  Filled 2019-06-19: qty 2

## 2019-06-19 MED ORDER — SODIUM CHLORIDE 0.9 % WEIGHT BASED INFUSION: 1.0000 mL/kg/h | INTRAVENOUS | Status: DC

## 2019-06-19 MED ORDER — LIDOCAINE HCL (PF) 1 % IJ SOLN
INTRAMUSCULAR | Status: AC
  Filled 2019-06-19: qty 30

## 2019-06-19 MED ORDER — HEPARIN (PORCINE) IN NACL 1000-0.9 UT/500ML-% IV SOLN
INTRAVENOUS | Status: AC
  Filled 2019-06-19: qty 1000

## 2019-06-19 MED ORDER — SODIUM CHLORIDE 0.9% FLUSH: 3.0000 mL | INTRAVENOUS | Status: DC | PRN

## 2019-06-19 MED ORDER — HYDRALAZINE HCL 20 MG/ML IJ SOLN: 10.0000 mg | INTRAMUSCULAR | Status: AC | PRN

## 2019-06-19 MED ORDER — HEPARIN (PORCINE) IN NACL 1000-0.9 UT/500ML-% IV SOLN
INTRAVENOUS | Status: DC | PRN
  Administered 2019-06-19 (×2): 500 mL

## 2019-06-19 MED ORDER — FENTANYL CITRATE (PF) 100 MCG/2ML IJ SOLN
INTRAMUSCULAR | Status: DC | PRN
  Administered 2019-06-19: 25 ug via INTRAVENOUS

## 2019-06-19 MED ORDER — SODIUM CHLORIDE 0.9 % IV SOLN: INTRAVENOUS | Status: AC

## 2019-06-19 MED ORDER — SODIUM CHLORIDE 0.9 % WEIGHT BASED INFUSION
3.0000 mL/kg/h | INTRAVENOUS | Status: AC
  Administered 2019-06-19: 3 mL/kg/h via INTRAVENOUS

## 2019-06-19 MED ORDER — IOHEXOL 350 MG/ML SOLN
INTRAVENOUS | Status: DC | PRN
  Administered 2019-06-19: 50 mL via INTRA_ARTERIAL

## 2019-06-19 SURGICAL SUPPLY — 10 items
CATH DXT MULTI JL4 JR4 ANG PIG (CATHETERS) ×1 IMPLANT
GLIDESHEATH SLEND SS 6F .021 (SHEATH) ×1 IMPLANT
GUIDEWIRE INQWIRE 1.5J.035X260 (WIRE) IMPLANT
INQWIRE 1.5J .035X260CM (WIRE) ×2
KIT HEART LEFT (KITS) ×2 IMPLANT
PACK CARDIAC CATHETERIZATION (CUSTOM PROCEDURE TRAY) ×2 IMPLANT
SHEATH PINNACLE 5F 10CM (SHEATH) ×1 IMPLANT
SYR MEDRAD MARK 7 150ML (SYRINGE) ×2 IMPLANT
TRANSDUCER W/STOPCOCK (MISCELLANEOUS) ×2 IMPLANT
WIRE EMERALD 3MM-J .035X150CM (WIRE) ×1 IMPLANT

## 2019-06-19 NOTE — Interval H&P Note (Signed)
History and Physical Interval Note:  06/19/2019 7:38 AM  [name] [name]  has presented today for surgery, with the diagnosis of unstable angina.  The various methods of treatment have been discussed with the patient and family. After consideration of risks, benefits and other options for treatment, the patient has consented to  Procedure(s): LEFT HEART CATH AND CORONARY ANGIOGRAPHY (N/A) as a surgical intervention.  The patient's history has been reviewed, patient examined, no change in status, stable for surgery.  I have reviewed the patient's chart and labs.  Questions were answered to the patient's satisfaction.     Birdie Riddle

## 2019-06-19 NOTE — Progress Notes (Signed)
Site area: rt groin fa sheath pulled by Darrell Jewel Site Prior to Removal:  Level 0 Pressure Applied For: 20 minutes Manual:   yes Patient Status During Pull:  stable Post Pull Site:  Level 0 Post Pull Instructions Given:  yes Post Pull Pulses Present: rt dp palpable Dressing Applied:  Gauze and tegaderm Bedrest begins @ 0845 Comments:

## 2019-06-19 NOTE — Progress Notes (Signed)
  Echocardiogram 2D Echocardiogram has been performed.  Jannett Celestine 06/19/2019, 11:02 AM

## 2019-06-19 NOTE — Progress Notes (Signed)
[facility] for Heparin Indication: chest pain/ACS  Heparin Dosing Weight: 68.4 kg  Labs: Recent Labs    06/18/19 1230 06/18/19 1445 06/18/19 2301 06/19/19 0327  HGB 13.0  --   --  12.6  HCT 39.0  --   --  38.7  PLT 277  --   --  270  LABPROT  --   --   --  13.0  INR  --   --   --  1.0  HEPARINUNFRC  --   --  0.72* 0.67  CREATININE 0.95  --   --   --   TROPONINIHS <2 <2  --   --     Estimated Creatinine Clearance: 53.1 mL/min (by C-G formula based on SCr of 0.95 mg/dL).  Assessment: 73 yof presenting with CP, elevated high-sensitivity troponin. Pharmacy consulted to dose heparin. Patient is not on anticoagulation PTA. CBC wnl. No active bleed issues documented  12/21 AM update:  Heparin level therapeutic x 1 after rate decrease  Goal of Therapy:  Heparin level 0.3-0.7 units/ml Monitor platelets by anticoagulation protocol: Yes   Plan:  Cont heparin at 800 units/hr Confirmatory heparin level at [place], PharmD, [place] Pharmacist Phone: [phone]

## 2019-06-19 NOTE — Progress Notes (Signed)
Central telemetry notified shift RN that pt was showing wide QRS complexes on the monitor, pt was asymptomatic. Will continue to monitor.  Elaina Hoops, RN

## 2019-06-20 DIAGNOSIS — Z23 Encounter for immunization: Secondary | ICD-10-CM | POA: Diagnosis not present

## 2019-06-20 LAB — BASIC METABOLIC PANEL
Anion gap: 8 (ref 5–15)
BUN: 14 mg/dL (ref 8–23)
CO2: 23 mmol/L (ref 22–32)
Calcium: 9 mg/dL (ref 8.9–10.3)
Chloride: 108 mmol/L (ref 98–111)
Creatinine, Ser: 0.99 mg/dL (ref 0.44–1.00)
GFR calc Af Amer: >60 mL/min (ref >60)
GFR calc non Af Amer: 58 mL/min — ABNORMAL LOW (ref >60)
Glucose, Bld: 150 mg/dL — ABNORMAL HIGH (ref 70–99)
Potassium: 4.2 mmol/L (ref 3.5–5.1)
Sodium: 139 mmol/L (ref 135–145)

## 2019-06-20 LAB — GLUCOSE, CAPILLARY: Glucose-Capillary: 124 mg/dL — ABNORMAL HIGH (ref 70–99)

## 2019-06-20 LAB — CBC
HCT: 38.6 % (ref 36.0–46.0)
Hemoglobin: 12.5 g/dL (ref 12.0–15.0)
MCH: 30.6 pg (ref 26.0–34.0)
MCHC: 32.4 g/dL (ref 30.0–36.0)
MCV: 94.4 fL (ref 80.0–100.0)
Platelets: 205 10*3/uL (ref 150–400)
RBC: 4.09 MIL/uL (ref 3.87–5.11)
RDW: 14 % (ref 11.5–15.5)
WBC: 5.8 10*3/uL (ref 4.0–10.5)
nRBC: 0 % (ref 0.0–0.2)

## 2019-06-20 LAB — ECHOCARDIOGRAM COMPLETE
Height: 64 in
Weight: 2456 oz

## 2019-06-20 MED ORDER — NITROGLYCERIN 0.4 MG SL SUBL: 0.4000 mg | SUBLINGUAL_TABLET | SUBLINGUAL | 1 refills | Status: AC | PRN

## 2019-06-20 MED ORDER — LOSARTAN POTASSIUM 25 MG PO TABS: 25.0000 mg | ORAL_TABLET | Freq: Every day | ORAL | 1 refills | Status: DC

## 2019-06-20 MED ORDER — METFORMIN HCL 500 MG PO TABS: 500.0000 mg | ORAL_TABLET | Freq: Two times a day (BID) | ORAL | Status: DC

## 2019-06-20 MED ORDER — PANTOPRAZOLE SODIUM 40 MG PO TBEC: 40.0000 mg | DELAYED_RELEASE_TABLET | Freq: Every day | ORAL | 3 refills | Status: DC

## 2019-06-20 MED ORDER — METOPROLOL TARTRATE 25 MG PO TABS: 12.5000 mg | ORAL_TABLET | Freq: Two times a day (BID) | ORAL | 1 refills | Status: DC

## 2019-06-20 NOTE — Discharge Summary (Signed)
Physician Discharge Summary  Patient ID: [name] [name] MRN: [id number] DOB/AGE: 11-14-1950 68 y.o.  Admit date: 06/18/2019 Discharge date: 06/20/2019  Admission Diagnoses: Acute coronary syndrome Type 2 DM S/p Left breast mastectomy for ductal carcinoma  Discharge Diagnoses:  Principle problem: * Coronary microvascular disease* Active Problems:   Chest pain   Type 2 DM   S/P left breast mastectomy for recurrent ductal carcinoma   Possible GERD  Discharged Condition: good  Hospital Course: 68 years old black female has PMH of type 2 DM and recurrent ductal carcinoma diagnosed in 1999 and post chemo and radiation had left mastectomy in 2006. She had prolonged chest pressure with anterior wall ischemia on EKG. She underwent cardiac catheterization which showed normal coronaries with mild apical hypokinesia on left ventriculogram. She was started on metoprolol, losartan, pantoprazole and SL NTG. She was chest pain free on day of discharge. She will see primary care in 1 week and see me in 2 weeks.  Consults: cardiology  Significant Diagnostic Studies: labs: Normal CBC, Troponin I and BMET except blood sugar levels.  EKG: SR, anterior ischemia.  Chest x-ray: Unremarkable.  Echocardiogram: 06/19/2019: Preserved LV systolic function with mild apical hypokinesia.  Cardiac cath: 12/21/2020Normal coronaries with mild apical hypokinesia..  Treatments: cardiac meds: Losartan, metoprolol and Sl NTG. She was also started on Pantoprazole.  Discharge Exam: Blood pressure (!) 109/46, pulse 60, temperature 98.1 F (36.7 C), temperature source Oral, resp. rate 18, height 5\' 4"  (1.626 m), weight 69.2 kg, SpO2 100 %. General appearance: alert, cooperative and appears stated age. Head: Normocephalic, atraumatic. Eyes: Brown eyes, pink conjunctiva, corneas clear. PERRL, EOM's intact.  Neck: No adenopathy, no carotid bruit, no JVD, supple, symmetrical, trachea midline and thyroid not  enlarged. Resp: Clear to auscultation bilaterally. Cardio: Regular rate and rhythm, S1, S2 normal, II/VI systolic murmur, no click, rub or gallop. GI: Soft, non-tender; bowel sounds normal; no organomegaly. Extremities: No edema, cyanosis or clubbing. No right groin hematoma or discarge or pain. Skin: Warm and dry.  Neurologic: Alert and oriented X 3, normal strength and tone. Normal coordination and gait.  Disposition: Discharge disposition: 01-Home or Self Care        Allergies as of 06/20/2019   No Known Allergies     Medication List    TAKE these medications   aspirin EC 81 MG tablet Take 81 mg by mouth daily.   atorvastatin 20 MG tablet Commonly known as: LIPITOR Take 20 mg by mouth daily.   cholecalciferol 1000 units tablet Commonly known as: VITAMIN D Take 1,000 Units by mouth 2 (two) times daily.   EPINEPHrine 0.15 MG/0.15ML injection Commonly known as: ADRENACLICK Inject [id number] mLs (0.15 mg total) into the muscle as needed for anaphylaxis.   losartan 25 MG tablet Commonly known as: COZAAR Take 1 tablet (25 mg total) by mouth daily. Start taking on: June 21, 2019   metFORMIN 500 MG tablet Commonly known as: GLUCOPHAGE Take 1 tablet (500 mg total) by mouth 2 (two) times daily with a meal. Starting 06/22/2019 What changed: additional instructions   metoprolol tartrate 25 MG tablet Commonly known as: LOPRESSOR Take 0.5 tablets (12.5 mg total) by mouth 2 (two) times daily.   nitroGLYCERIN 0.4 MG SL tablet Commonly known as: NITROSTAT Place 1 tablet (0.4 mg total) under the tongue every 5 (five) minutes x 3 doses as needed for chest pain.   pantoprazole 40 MG tablet Commonly known as: PROTONIX Take 1 tablet (40 mg total) by mouth daily. Start  taking on: June 21, 2019      Follow-up Information    Kelton Pillar, MD. Schedule an appointment as soon as possible for a visit in 1 week(s).   Specialty: Family Medicine Contact information: [address] [place] [address] [phone]        Dixie Dials, MD. Schedule an appointment as soon as possible for a visit in 2 week(s).   Specialty: Cardiology Contact information: [place] [address] [phone]           Time spent: Review of old chart, current chart, lab, x-ray, cardiac tests and discussion with patient over 60 minutes.  Signed: Birdie Riddle 06/20/2019, 1:29 PM

## 2019-07-03 DIAGNOSIS — E7800 Pure hypercholesterolemia, unspecified: Secondary | ICD-10-CM | POA: Diagnosis not present

## 2019-07-03 DIAGNOSIS — K2960 Other gastritis without bleeding: Secondary | ICD-10-CM | POA: Diagnosis not present

## 2019-07-03 DIAGNOSIS — E1169 Type 2 diabetes mellitus with other specified complication: Secondary | ICD-10-CM | POA: Diagnosis not present

## 2019-07-03 DIAGNOSIS — R079 Chest pain, unspecified: Secondary | ICD-10-CM | POA: Diagnosis not present

## 2019-07-04 DIAGNOSIS — I429 Cardiomyopathy, unspecified: Secondary | ICD-10-CM | POA: Diagnosis not present

## 2019-07-04 DIAGNOSIS — E119 Type 2 diabetes mellitus without complications: Secondary | ICD-10-CM | POA: Diagnosis not present

## 2019-07-04 DIAGNOSIS — Z853 Personal history of malignant neoplasm of breast: Secondary | ICD-10-CM | POA: Diagnosis not present

## 2019-07-04 DIAGNOSIS — R072 Precordial pain: Secondary | ICD-10-CM | POA: Diagnosis not present

## 2019-08-05 DIAGNOSIS — R519 Headache, unspecified: Secondary | ICD-10-CM | POA: Diagnosis not present

## 2019-08-05 DIAGNOSIS — Z853 Personal history of malignant neoplasm of breast: Secondary | ICD-10-CM | POA: Insufficient documentation

## 2019-08-05 DIAGNOSIS — R05 Cough: Secondary | ICD-10-CM | POA: Diagnosis not present

## 2019-08-05 DIAGNOSIS — E119 Type 2 diabetes mellitus without complications: Secondary | ICD-10-CM | POA: Insufficient documentation

## 2019-08-05 DIAGNOSIS — Z20822 Contact with and (suspected) exposure to covid-19: Secondary | ICD-10-CM | POA: Diagnosis not present

## 2019-08-05 DIAGNOSIS — E785 Hyperlipidemia, unspecified: Secondary | ICD-10-CM | POA: Insufficient documentation

## 2019-08-05 DIAGNOSIS — R6883 Chills (without fever): Secondary | ICD-10-CM | POA: Diagnosis not present

## 2019-08-08 DIAGNOSIS — J069 Acute upper respiratory infection, unspecified: Secondary | ICD-10-CM | POA: Diagnosis not present

## 2019-08-08 DIAGNOSIS — J0110 Acute frontal sinusitis, unspecified: Secondary | ICD-10-CM | POA: Diagnosis not present

## 2019-08-08 DIAGNOSIS — Z7189 Other specified counseling: Secondary | ICD-10-CM | POA: Diagnosis not present

## 2019-08-19 ENCOUNTER — Ambulatory Visit: Payer: Medicare HMO | Attending: Internal Medicine

## 2019-08-19 DIAGNOSIS — Z23 Encounter for immunization: Secondary | ICD-10-CM | POA: Insufficient documentation

## 2019-08-19 NOTE — Progress Notes (Signed)
   Covid-19 Vaccination Clinic  Name:  [name] [name]    MRN: [id number] DOB: 05/18/1951  08/19/2019  [name] was observed post Covid-19 immunization for 15 minutes without incidence. She was provided with Vaccine Information Sheet and instruction to access the V-Safe system.   [name] was instructed to call 911 with any severe reactions post vaccine: Marland Kitchen Difficulty breathing  . Swelling of your face and throat  . A fast heartbeat  . A bad rash all over your body  . Dizziness and weakness    Immunizations Administered    Name Date Dose VIS Date Route   Pfizer COVID-19 Vaccine 08/19/2019 11:12 AM 0.3 mL 06/09/2019 Intramuscular   Manufacturer: [place]   Lot: [id number]   [place]: [id number]

## 2019-09-11 ENCOUNTER — Ambulatory Visit: Payer: Medicare HMO | Attending: Internal Medicine

## 2019-09-11 DIAGNOSIS — Z23 Encounter for immunization: Secondary | ICD-10-CM

## 2019-09-11 NOTE — Progress Notes (Signed)
   Covid-19 Vaccination Clinic  Name:  [name] [name]    MRN: [id number] DOB: 09-24-1950  09/11/2019  [name] [name] was observed post Covid-19 immunization for 15 minutes without incident. She was provided with Vaccine Information Sheet and instruction to access the V-Safe system.   [name] [name] was instructed to call 911 with any severe reactions post vaccine: Marland Kitchen Difficulty breathing  . Swelling of face and throat  . A fast heartbeat  . A bad rash all over body  . Dizziness and weakness   Immunizations Administered    Name Date Dose VIS Date Route   Pfizer COVID-19 Vaccine 09/11/2019  9:16 AM 0.3 mL 06/09/2019 Intramuscular   Manufacturer: [place]   Lot: [id number]   [place]: [id number]

## 2019-10-02 DIAGNOSIS — Z853 Personal history of malignant neoplasm of breast: Secondary | ICD-10-CM | POA: Diagnosis not present

## 2019-10-02 DIAGNOSIS — I425 Other restrictive cardiomyopathy: Secondary | ICD-10-CM | POA: Diagnosis not present

## 2019-10-02 DIAGNOSIS — I2510 Atherosclerotic heart disease of native coronary artery without angina pectoris: Secondary | ICD-10-CM | POA: Diagnosis not present

## 2019-10-02 DIAGNOSIS — E1169 Type 2 diabetes mellitus with other specified complication: Secondary | ICD-10-CM | POA: Diagnosis not present

## 2019-10-02 DIAGNOSIS — R072 Precordial pain: Secondary | ICD-10-CM | POA: Diagnosis not present

## 2019-10-02 DIAGNOSIS — E7800 Pure hypercholesterolemia, unspecified: Secondary | ICD-10-CM | POA: Diagnosis not present

## 2019-10-02 DIAGNOSIS — E119 Type 2 diabetes mellitus without complications: Secondary | ICD-10-CM | POA: Diagnosis not present

## 2019-10-13 ENCOUNTER — Other Ambulatory Visit: Payer: Self-pay | Admitting: Family Medicine

## 2019-10-13 DIAGNOSIS — Z1231 Encounter for screening mammogram for malignant neoplasm of breast: Secondary | ICD-10-CM

## 2019-10-19 DIAGNOSIS — H5203 Hypermetropia, bilateral: Secondary | ICD-10-CM | POA: Diagnosis not present

## 2019-10-20 ENCOUNTER — Ambulatory Visit
Admission: RE | Admit: 2019-10-20 | Discharge: 2019-10-20 | Disposition: A | Discharge status: Home / Self Care | Payer: Medicare HMO | Source: Ambulatory Visit | Attending: Family Medicine | Admitting: Family Medicine

## 2019-10-20 ENCOUNTER — Other Ambulatory Visit: Payer: Self-pay

## 2019-10-20 DIAGNOSIS — Z1231 Encounter for screening mammogram for malignant neoplasm of breast: Secondary | ICD-10-CM | POA: Diagnosis not present

## 2019-10-24 DIAGNOSIS — R197 Diarrhea, unspecified: Secondary | ICD-10-CM | POA: Diagnosis not present

## 2019-10-24 DIAGNOSIS — R05 Cough: Secondary | ICD-10-CM | POA: Diagnosis not present

## 2020-03-12 DIAGNOSIS — Z20822 Contact with and (suspected) exposure to covid-19: Secondary | ICD-10-CM | POA: Diagnosis not present

## 2020-04-02 DIAGNOSIS — E119 Type 2 diabetes mellitus without complications: Secondary | ICD-10-CM | POA: Diagnosis not present

## 2020-04-02 DIAGNOSIS — R072 Precordial pain: Secondary | ICD-10-CM | POA: Diagnosis not present

## 2020-04-02 DIAGNOSIS — R0602 Shortness of breath: Secondary | ICD-10-CM | POA: Diagnosis not present

## 2020-04-02 DIAGNOSIS — Z853 Personal history of malignant neoplasm of breast: Secondary | ICD-10-CM | POA: Diagnosis not present

## 2020-04-10 DIAGNOSIS — R0602 Shortness of breath: Secondary | ICD-10-CM | POA: Diagnosis not present

## 2020-04-10 DIAGNOSIS — R072 Precordial pain: Secondary | ICD-10-CM | POA: Diagnosis not present

## 2020-04-13 ENCOUNTER — Emergency Department (HOSPITAL_COMMUNITY): Payer: Medicare HMO

## 2020-04-13 ENCOUNTER — Encounter (HOSPITAL_COMMUNITY): Payer: Self-pay | Admitting: Emergency Medicine

## 2020-04-13 ENCOUNTER — Emergency Department (HOSPITAL_COMMUNITY)
Admission: EM | Admit: 2020-04-13 | Discharge: 2020-04-14 | Disposition: A | Discharge status: Home / Self Care | Payer: Medicare HMO | Attending: Emergency Medicine | Admitting: Emergency Medicine

## 2020-04-13 ENCOUNTER — Other Ambulatory Visit: Payer: Self-pay

## 2020-04-13 DIAGNOSIS — R42 Dizziness and giddiness: Secondary | ICD-10-CM | POA: Insufficient documentation

## 2020-04-13 DIAGNOSIS — I6782 Cerebral ischemia: Secondary | ICD-10-CM | POA: Diagnosis not present

## 2020-04-13 DIAGNOSIS — Z853 Personal history of malignant neoplasm of breast: Secondary | ICD-10-CM | POA: Diagnosis not present

## 2020-04-13 DIAGNOSIS — Z7984 Long term (current) use of oral hypoglycemic drugs: Secondary | ICD-10-CM | POA: Diagnosis not present

## 2020-04-13 DIAGNOSIS — E119 Type 2 diabetes mellitus without complications: Secondary | ICD-10-CM | POA: Diagnosis not present

## 2020-04-13 DIAGNOSIS — R29810 Facial weakness: Secondary | ICD-10-CM | POA: Diagnosis not present

## 2020-04-13 DIAGNOSIS — R110 Nausea: Secondary | ICD-10-CM | POA: Diagnosis not present

## 2020-04-13 DIAGNOSIS — Z7982 Long term (current) use of aspirin: Secondary | ICD-10-CM | POA: Insufficient documentation

## 2020-04-13 DIAGNOSIS — Z955 Presence of coronary angioplasty implant and graft: Secondary | ICD-10-CM | POA: Insufficient documentation

## 2020-04-13 DIAGNOSIS — G4489 Other headache syndrome: Secondary | ICD-10-CM | POA: Diagnosis not present

## 2020-04-13 DIAGNOSIS — R52 Pain, unspecified: Secondary | ICD-10-CM | POA: Diagnosis not present

## 2020-04-13 LAB — BASIC METABOLIC PANEL
Anion gap: 10 (ref 5–15)
BUN: 11 mg/dL (ref 8–23)
CO2: 27 mmol/L (ref 22–32)
Calcium: 9.5 mg/dL (ref 8.9–10.3)
Chloride: 105 mmol/L (ref 98–111)
Creatinine, Ser: 1.24 mg/dL — ABNORMAL HIGH (ref 0.44–1.00)
GFR, Estimated: 44 mL/min — ABNORMAL LOW (ref >60)
Glucose, Bld: 155 mg/dL — ABNORMAL HIGH (ref 70–99)
Potassium: 3.9 mmol/L (ref 3.5–5.1)
Sodium: 142 mmol/L (ref 135–145)

## 2020-04-13 LAB — CBC
HCT: 38.9 % (ref 36.0–46.0)
Hemoglobin: 12.1 g/dL (ref 12.0–15.0)
MCH: 27.1 pg (ref 26.0–34.0)
MCHC: 31.1 g/dL (ref 30.0–36.0)
MCV: 87.2 fL (ref 80.0–100.0)
Platelets: 279 10*3/uL (ref 150–400)
RBC: 4.46 MIL/uL (ref 3.87–5.11)
RDW: 16.3 % — ABNORMAL HIGH (ref 11.5–15.5)
WBC: 5.2 10*3/uL (ref 4.0–10.5)
nRBC: 0 % (ref 0.0–0.2)

## 2020-04-13 LAB — CBG MONITORING, ED: Glucose-Capillary: 133 mg/dL — ABNORMAL HIGH (ref 70–99)

## 2020-04-13 MED ORDER — MECLIZINE HCL 25 MG PO TABS
12.5000 mg | ORAL_TABLET | Freq: Once | ORAL | Status: AC
  Administered 2020-04-13: 12.5 mg via ORAL
  Filled 2020-04-13: qty 1

## 2020-04-13 NOTE — ED Provider Notes (Signed)
[place] DEPT Provider Note   CSN: [id number] Arrival date & time: 04/13/20  1906     History Chief Complaint  Patient presents with  . Dizziness    [name] [name] is a 69 y.o. female.  HPI    69 year old female history of breast cancer, diabetes, presents today with vertigo.  Patient states she was well when she went to bed last night.  When she woke up this morning at 530 she was dizzy.  Increases with any head movement.  She is having some balance issues due to this.  She denies any lateralized weakness.  She felt like her vision was blurry.  Has not had any chest pain, dyspnea, or abdominal pain.  She reports headache that started during the day.  Headache is in the frontal area bilaterally.  Patient is not on any blood thinners.  She has had no recent head trauma.  She reports one episode of vertigo several years ago.  She did not take any medication.  Is somewhat similar to that. Past Medical History:  Diagnosis Date  . Diabetes mellitus without complication ([place])   . Recurrent infiltrating ductal carcinoma of breast ([place]) 06/18/2011    Patient Active Problem List   Diagnosis Date Noted  . Acute coronary syndrome ([place]) 06/18/2019  . Recurrent infiltrating ductal carcinoma of breast ([place]) 06/18/2011    Past Surgical History:  Procedure Laterality Date  . LEFT HEART CATH AND CORONARY ANGIOGRAPHY N/A 06/19/2019   Procedure: LEFT HEART CATH AND CORONARY ANGIOGRAPHY;  Surgeon: Dixie Dials, MD;  Location: [facility];  Service: Cardiovascular;  Laterality: N/A;  . MASTECTOMY       OB History   No obstetric history on file.     History reviewed. No pertinent family history.  Social History   Tobacco Use  . Smoking status: Never Smoker  . Smokeless tobacco: Never Used  . Tobacco comment: never used product  Vaping Use  . Vaping Use: Never used  Substance Use Topics  . Alcohol use: No  . Drug use: No    Home  Medications Prior to Admission medications   Medication Sig Start Date End Date Taking? Authorizing Provider  aspirin EC 81 MG tablet Take 81 mg by mouth daily.    [provider]  atorvastatin (LIPITOR) 20 MG tablet Take 20 mg by mouth daily.    [provider]  cholecalciferol (VITAMIN D) 1000 UNITS tablet Take 1,000 Units by mouth 2 (two) times daily.     [provider]  EPINEPHrine 0.15 MG/0.15ML IJ injection Inject 0.15 mLs (0.15 mg total) into the muscle as needed for anaphylaxis. 04/03/18   Blanchie Dessert, MD  losartan (COZAAR) 25 MG tablet Take 1 tablet (25 mg total) by mouth daily. 06/21/19   Dixie Dials, MD  metFORMIN (GLUCOPHAGE) 500 MG tablet Take 1 tablet (500 mg total) by mouth 2 (two) times daily with a meal. Starting 06/22/2019 06/20/19   Dixie Dials, MD  metoprolol tartrate (LOPRESSOR) 25 MG tablet Take 0.5 tablets (12.5 mg total) by mouth 2 (two) times daily. 06/20/19   Dixie Dials, MD  nitroGLYCERIN (NITROSTAT) 0.4 MG SL tablet Place 1 tablet (0.4 mg total) under the tongue every 5 (five) minutes x 3 doses as needed for chest pain. 06/20/19   Dixie Dials, MD  pantoprazole (PROTONIX) 40 MG tablet Take 1 tablet (40 mg total) by mouth daily. 06/21/19   Dixie Dials, MD    Allergies    Patient  has no known allergies.  Review of Systems   Review of Systems  All other systems reviewed and are negative.   Physical Exam Updated Vital Signs BP 138/73 (BP Location: Right Arm)   Pulse 72   Temp 98.1 F (36.7 C) (Oral)   Resp 16   Ht 1.626 m (5\' 4" )   Wt 68 kg   SpO2 100%   BMI 25.75 kg/m   Physical Exam Vitals and nursing note reviewed.  Constitutional:      General: She is not in acute distress.    Appearance: Normal appearance.  HENT:     Head: Normocephalic and atraumatic.     Right Ear: External ear normal.     Left Ear: External ear normal.     Nose: Nose normal.     Mouth/Throat:     Mouth: Mucous membranes are moist.    Eyes:     Extraocular Movements: Extraocular movements intact.     Pupils: Pupils are equal, round, and reactive to light.  Cardiovascular:     Rate and Rhythm: Normal rate and regular rhythm.     Pulses: Normal pulses.  Pulmonary:     Effort: Pulmonary effort is normal.     Breath sounds: Normal breath sounds.  Abdominal:     General: Abdomen is flat. Bowel sounds are normal.     Palpations: Abdomen is soft.  Musculoskeletal:        General: Normal range of motion.     Cervical back: Normal range of motion.  Skin:    General: Skin is warm and dry.     Capillary Refill: Capillary refill takes less than 2 seconds.  Neurological:     General: No focal deficit present.     Mental Status: She is alert and oriented to person, place, and time. Mental status is at baseline.     Cranial Nerves: No cranial nerve deficit.     Sensory: No sensory deficit.     Motor: No weakness.     Coordination: Coordination normal.     Deep Tendon Reflexes: Reflexes normal.  Psychiatric:        Mood and Affect: Mood normal.        Behavior: Behavior normal.     ED Results / Procedures / Treatments   Labs (all labs ordered are listed, but only abnormal results are displayed) Labs Reviewed  CBC - Abnormal; Notable for the following components:      Result Value   RDW 16.3 (*)    All other components within normal limits  CBG MONITORING, ED - Abnormal; Notable for the following components:   Glucose-Capillary 133 (*)    All other components within normal limits  BASIC METABOLIC PANEL  URINALYSIS, ROUTINE W REFLEX MICROSCOPIC    EKG EKG Interpretation  Date/Time:  Saturday April 13 2020 19:22:46 EDT Ventricular Rate:  79 PR Interval:    QRS Duration: 94 QT Interval:  380 QTC Calculation: 436 R Axis:   11 Text Interpretation: Sinus rhythm Low voltage, extremity and precordial leads 12 Lead; [place] Confirmed by Pattricia Boss [phone]) on 04/13/2020 8:09:21 PM   Radiology No  results found.  Procedures Procedures (including critical care time)  Medications Ordered in ED Medications  meclizine (ANTIVERT) tablet 12.5 mg (has no administration in time range)    ED Course  I have reviewed the triage vital signs and the nursing notes.  Pertinent labs & imaging results that were available during my care of the patient were  reviewed by me and considered in my medical decision making (see chart for details).  Clinical Course as of Apr 13 2325  Sat Apr 13, 2020  2140 Patient feels slightly improve Awaiting radiology read of head ct- no acute changes noted on my review. Plan mri   [DR]  2324 Work up without acute abnormality Head ct without acute abnormality- reviewed images and per rad reading    [DR]    Clinical Course User Index [DR] Pattricia Boss, MD   MDM Rules/Calculators/A&P                          69 yo female with vertigo and headache.  History of breast ca.  MRI pending to evaluate for stroke vs mets vs bpv Discussed with Dr. Florina Ou and he will dispo after mr  Final Clinical Impression(s) / ED Diagnoses Final diagnoses:  Vertigo    Rx / DC Orders ED Discharge Orders    None       Pattricia Boss, MD 04/13/20 2326

## 2020-04-13 NOTE — ED Triage Notes (Addendum)
Pt reports that she woke up with dizziness, nausea and headache around 530a. Dizziness gets worse when she moves her head. Reports a similar episode about 2 years ago when she was diagnosed with vertigo. A&Ox4. No unilateral numbness or tingling noted. No facial droop or slurred speech. No LOC. Also states that she saw her cardiologist on Thursday and he told her she may have an ear infection.

## 2020-04-14 ENCOUNTER — Emergency Department (HOSPITAL_COMMUNITY): Payer: Medicare HMO

## 2020-04-14 DIAGNOSIS — I6782 Cerebral ischemia: Secondary | ICD-10-CM | POA: Diagnosis not present

## 2020-04-14 DIAGNOSIS — R42 Dizziness and giddiness: Secondary | ICD-10-CM | POA: Diagnosis not present

## 2020-04-14 LAB — URINALYSIS, ROUTINE W REFLEX MICROSCOPIC
Bacteria, UA: NONE SEEN
Bilirubin Urine: NEGATIVE
Glucose, UA: >=500 mg/dL — AB
Hgb urine dipstick: NEGATIVE
Ketones, ur: 5 mg/dL — AB
Leukocytes,Ua: NEGATIVE
Nitrite: NEGATIVE
Protein, ur: NEGATIVE mg/dL
Specific Gravity, Urine: 1.028 (ref 1.005–1.030)
pH: 5 (ref 5.0–8.0)

## 2020-04-14 MED ORDER — GADOBUTROL 1 MMOL/ML IV SOLN
7.0000 mL | Freq: Once | INTRAVENOUS | Status: AC | PRN
  Administered 2020-04-14: 7 mL via INTRAVENOUS

## 2020-04-14 NOTE — ED Notes (Signed)
Pt back from MRI. Pt placed back on purewick and monitor

## 2020-04-14 NOTE — ED Provider Notes (Signed)
MRI findings reviewed with patient.  No acute process noted on her MRI of the brain.   Patient is ready for discharge.   Dorie Rank, MD 04/14/20 1012

## 2020-04-19 DIAGNOSIS — Z1389 Encounter for screening for other disorder: Secondary | ICD-10-CM | POA: Diagnosis not present

## 2020-04-19 DIAGNOSIS — Z853 Personal history of malignant neoplasm of breast: Secondary | ICD-10-CM | POA: Diagnosis not present

## 2020-04-19 DIAGNOSIS — Z0000 Encounter for general adult medical examination without abnormal findings: Secondary | ICD-10-CM | POA: Diagnosis not present

## 2020-04-19 DIAGNOSIS — E7800 Pure hypercholesterolemia, unspecified: Secondary | ICD-10-CM | POA: Diagnosis not present

## 2020-04-19 DIAGNOSIS — R42 Dizziness and giddiness: Secondary | ICD-10-CM | POA: Diagnosis not present

## 2020-04-19 DIAGNOSIS — Z23 Encounter for immunization: Secondary | ICD-10-CM | POA: Diagnosis not present

## 2020-04-19 DIAGNOSIS — E1169 Type 2 diabetes mellitus with other specified complication: Secondary | ICD-10-CM | POA: Diagnosis not present

## 2020-04-19 DIAGNOSIS — I2510 Atherosclerotic heart disease of native coronary artery without angina pectoris: Secondary | ICD-10-CM | POA: Diagnosis not present

## 2020-05-03 ENCOUNTER — Other Ambulatory Visit: Payer: Self-pay | Admitting: Family Medicine

## 2020-05-03 DIAGNOSIS — M8588 Other specified disorders of bone density and structure, other site: Secondary | ICD-10-CM

## 2020-05-30 DIAGNOSIS — Z20822 Contact with and (suspected) exposure to covid-19: Secondary | ICD-10-CM | POA: Diagnosis not present

## 2020-07-11 DIAGNOSIS — Z20822 Contact with and (suspected) exposure to covid-19: Secondary | ICD-10-CM | POA: Diagnosis not present

## 2020-08-12 DIAGNOSIS — I429 Cardiomyopathy, unspecified: Secondary | ICD-10-CM | POA: Diagnosis not present

## 2020-08-12 DIAGNOSIS — E119 Type 2 diabetes mellitus without complications: Secondary | ICD-10-CM | POA: Diagnosis not present

## 2020-08-12 DIAGNOSIS — R072 Precordial pain: Secondary | ICD-10-CM | POA: Diagnosis not present

## 2020-08-12 DIAGNOSIS — Z853 Personal history of malignant neoplasm of breast: Secondary | ICD-10-CM | POA: Diagnosis not present

## 2020-10-10 DIAGNOSIS — E1169 Type 2 diabetes mellitus with other specified complication: Secondary | ICD-10-CM | POA: Diagnosis not present

## 2020-10-10 DIAGNOSIS — I2510 Atherosclerotic heart disease of native coronary artery without angina pectoris: Secondary | ICD-10-CM | POA: Diagnosis not present

## 2020-10-10 DIAGNOSIS — E7800 Pure hypercholesterolemia, unspecified: Secondary | ICD-10-CM | POA: Diagnosis not present

## 2020-12-12 ENCOUNTER — Other Ambulatory Visit: Payer: Self-pay | Admitting: Family Medicine

## 2020-12-12 DIAGNOSIS — Z1231 Encounter for screening mammogram for malignant neoplasm of breast: Secondary | ICD-10-CM

## 2021-01-09 ENCOUNTER — Encounter: Payer: Self-pay | Admitting: Gastroenterology

## 2021-01-09 DIAGNOSIS — E1169 Type 2 diabetes mellitus with other specified complication: Secondary | ICD-10-CM | POA: Diagnosis not present

## 2021-01-09 DIAGNOSIS — E7800 Pure hypercholesterolemia, unspecified: Secondary | ICD-10-CM | POA: Diagnosis not present

## 2021-01-14 DIAGNOSIS — M85851 Other specified disorders of bone density and structure, right thigh: Secondary | ICD-10-CM | POA: Diagnosis not present

## 2021-01-14 DIAGNOSIS — Z780 Asymptomatic menopausal state: Secondary | ICD-10-CM | POA: Diagnosis not present

## 2021-01-14 DIAGNOSIS — M85852 Other specified disorders of bone density and structure, left thigh: Secondary | ICD-10-CM | POA: Diagnosis not present

## 2021-02-05 ENCOUNTER — Ambulatory Visit
Admission: RE | Admit: 2021-02-05 | Discharge: 2021-02-05 | Disposition: A | Discharge status: Home / Self Care | Payer: Medicare HMO | Source: Ambulatory Visit | Attending: Family Medicine | Admitting: Family Medicine

## 2021-02-05 ENCOUNTER — Other Ambulatory Visit: Payer: Self-pay

## 2021-02-05 DIAGNOSIS — Z1231 Encounter for screening mammogram for malignant neoplasm of breast: Secondary | ICD-10-CM | POA: Diagnosis not present

## 2021-02-10 ENCOUNTER — Other Ambulatory Visit: Payer: Self-pay | Admitting: Family Medicine

## 2021-02-10 DIAGNOSIS — R928 Other abnormal and inconclusive findings on diagnostic imaging of breast: Secondary | ICD-10-CM

## 2021-02-17 ENCOUNTER — Other Ambulatory Visit: Payer: Self-pay

## 2021-02-17 ENCOUNTER — Ambulatory Visit: Payer: Medicare HMO

## 2021-02-17 ENCOUNTER — Ambulatory Visit
Admission: RE | Admit: 2021-02-17 | Discharge: 2021-02-17 | Disposition: A | Discharge status: Home / Self Care | Payer: Medicare HMO | Source: Ambulatory Visit | Attending: Family Medicine | Admitting: Family Medicine

## 2021-02-17 DIAGNOSIS — N6489 Other specified disorders of breast: Secondary | ICD-10-CM | POA: Diagnosis not present

## 2021-02-17 DIAGNOSIS — R928 Other abnormal and inconclusive findings on diagnostic imaging of breast: Secondary | ICD-10-CM

## 2021-02-17 DIAGNOSIS — R922 Inconclusive mammogram: Secondary | ICD-10-CM | POA: Diagnosis not present

## 2021-02-21 DIAGNOSIS — Z20822 Contact with and (suspected) exposure to covid-19: Secondary | ICD-10-CM | POA: Diagnosis not present

## 2021-03-14 DIAGNOSIS — E1169 Type 2 diabetes mellitus with other specified complication: Secondary | ICD-10-CM | POA: Diagnosis not present

## 2021-03-14 DIAGNOSIS — G933 Postviral fatigue syndrome: Secondary | ICD-10-CM | POA: Diagnosis not present

## 2021-03-14 DIAGNOSIS — R059 Cough, unspecified: Secondary | ICD-10-CM | POA: Diagnosis not present

## 2021-05-27 DIAGNOSIS — I2510 Atherosclerotic heart disease of native coronary artery without angina pectoris: Secondary | ICD-10-CM | POA: Diagnosis not present

## 2021-05-27 DIAGNOSIS — Z853 Personal history of malignant neoplasm of breast: Secondary | ICD-10-CM | POA: Diagnosis not present

## 2021-05-27 DIAGNOSIS — M8588 Other specified disorders of bone density and structure, other site: Secondary | ICD-10-CM | POA: Diagnosis not present

## 2021-05-27 DIAGNOSIS — E7800 Pure hypercholesterolemia, unspecified: Secondary | ICD-10-CM | POA: Diagnosis not present

## 2021-05-27 DIAGNOSIS — E1169 Type 2 diabetes mellitus with other specified complication: Secondary | ICD-10-CM | POA: Diagnosis not present

## 2021-05-27 DIAGNOSIS — Z7984 Long term (current) use of oral hypoglycemic drugs: Secondary | ICD-10-CM | POA: Diagnosis not present

## 2021-05-27 DIAGNOSIS — Z0000 Encounter for general adult medical examination without abnormal findings: Secondary | ICD-10-CM | POA: Diagnosis not present

## 2021-05-27 DIAGNOSIS — Z1211 Encounter for screening for malignant neoplasm of colon: Secondary | ICD-10-CM | POA: Diagnosis not present

## 2021-08-10 ENCOUNTER — Encounter: Payer: Self-pay | Admitting: Internal Medicine

## 2021-09-25 ENCOUNTER — Encounter: Payer: Self-pay | Admitting: Gastroenterology

## 2021-10-01 ENCOUNTER — Encounter: Payer: Self-pay | Admitting: *Deleted

## 2021-10-18 IMAGING — CT CT HEAD W/O CM
3 of 4 series · 15 of 47 positions shown, 18 images · non-contrast
Comparison: CT head 04/08/2004.

CLINICAL DATA: Cerebral hemorrhage suspected. Dizziness. History of
breast carcinoma.

EXAM:
CT HEAD WITHOUT CONTRAST
TECHNIQUE: Contiguous axial images were obtained from the base of the skull
through the vertex without intravenous contrast.

[Series 2: head wo · axial · 0.43mm/px · z∈[-151,-31]mm · 9 of 30 slices shown, 12 images]
[im 3/30  brain]
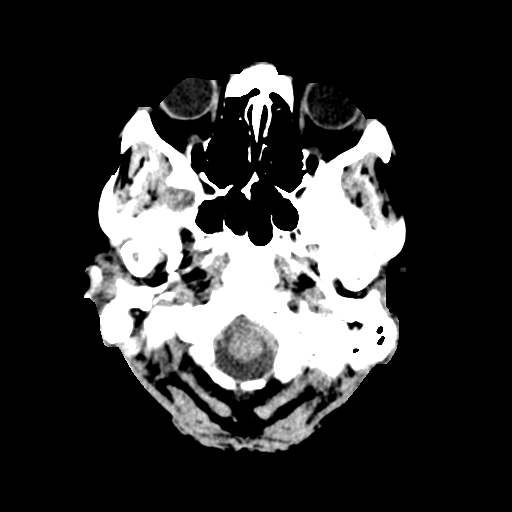
[im 3/30  bone]
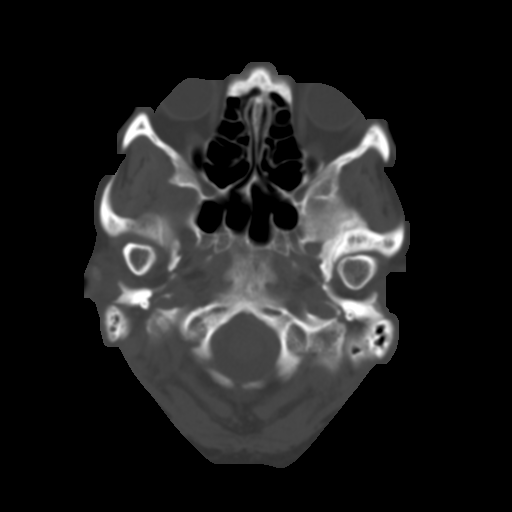
[im 6/30  brain]
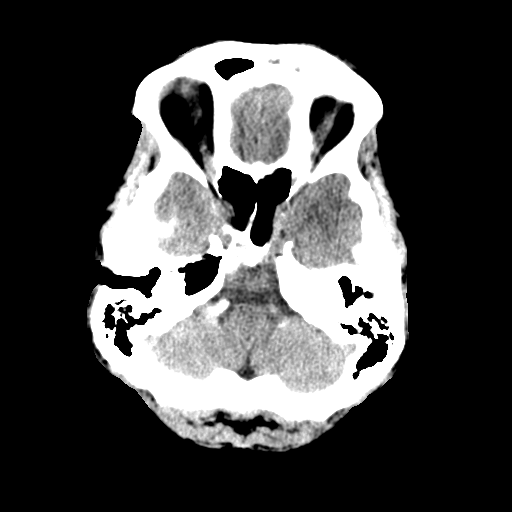
[im 9/30  brain]
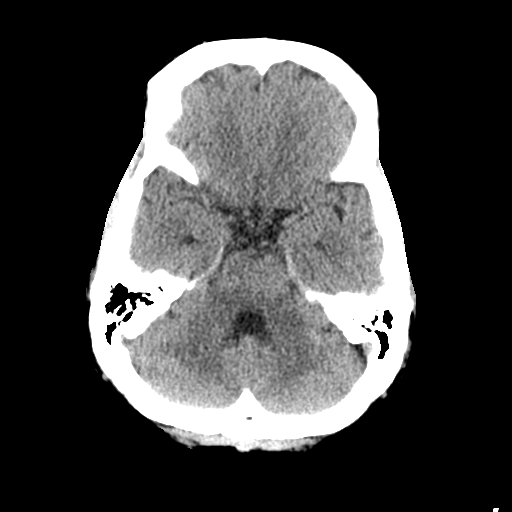
[im 12/30  brain]
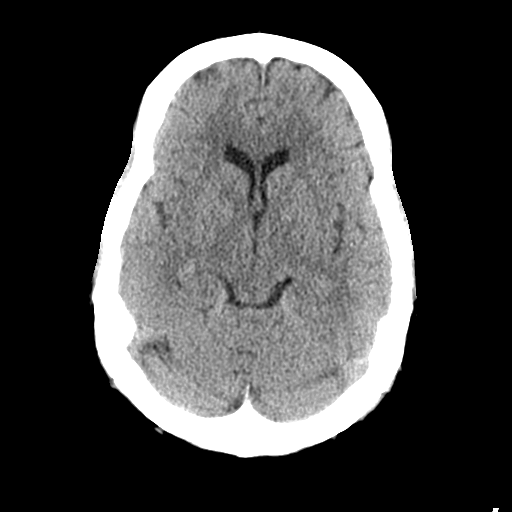
[im 15/30  brain]
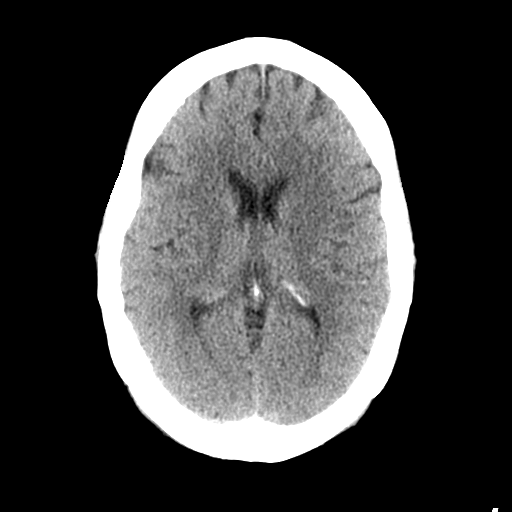
[im 15/30  bone]
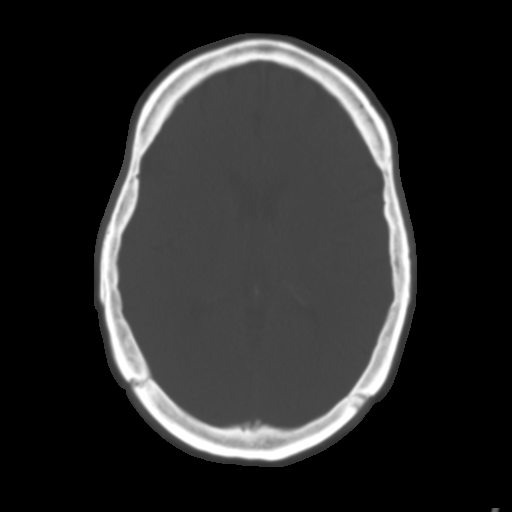
[im 18/30  brain]
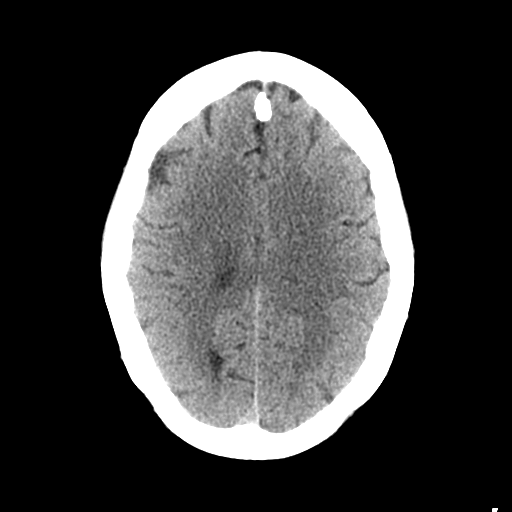
[im 21/30  brain]
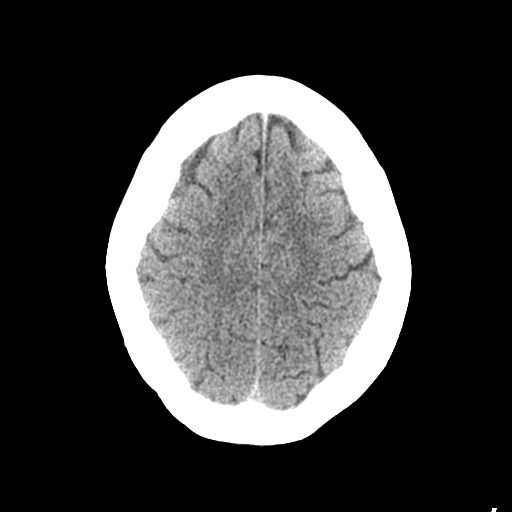
[im 24/30  brain]
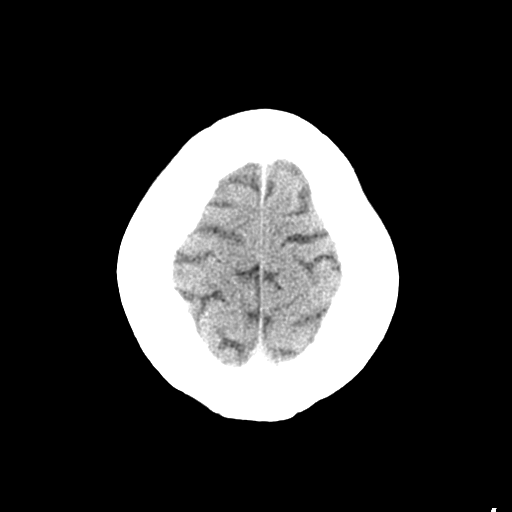
[im 27/30  brain]
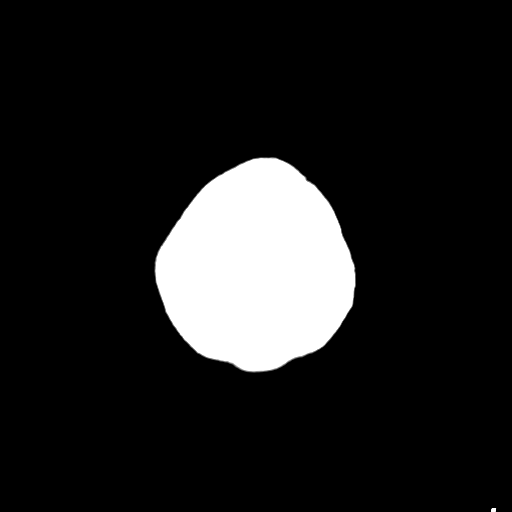
[im 27/30  bone]
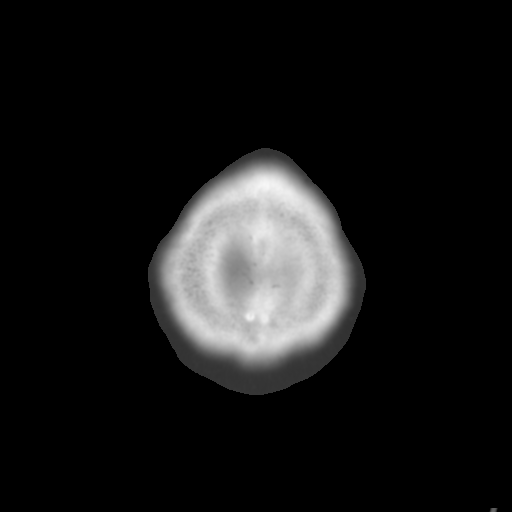

[Series 5: coronal soft tissue · coronal · 0.32mm/px · 3 of 68 slices shown]
[im 23/68  brain]
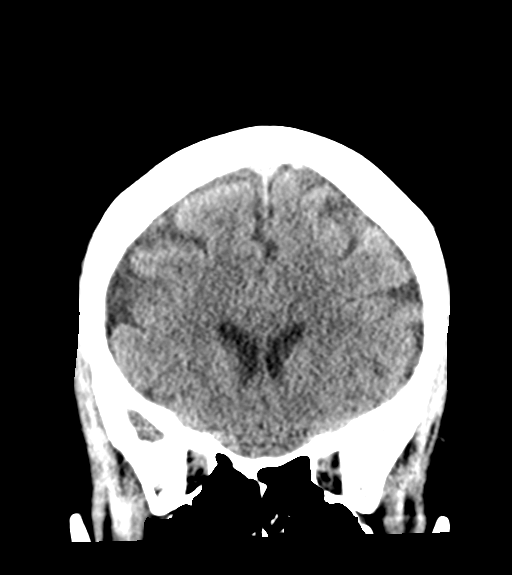
[im 30/68  brain]
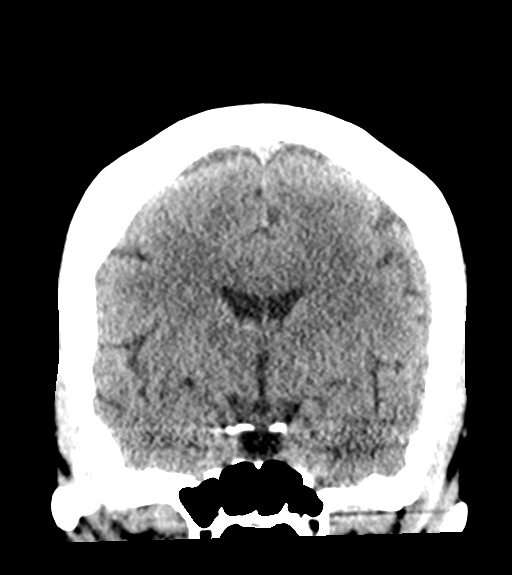
[im 38/68  brain]
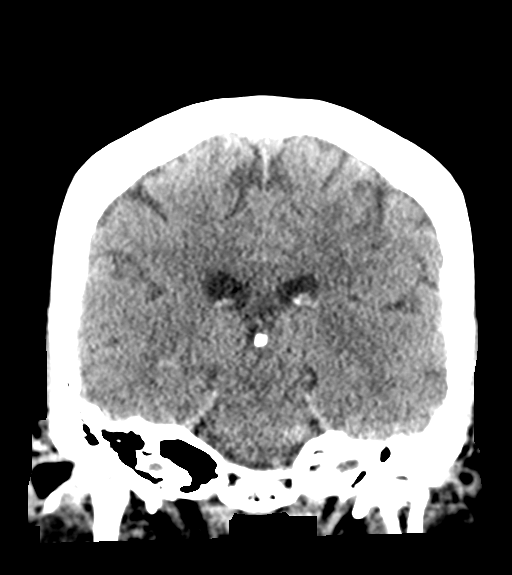

[Series 6: sagittal soft tissue · sagittal · 0.32mm/px · 3 of 55 slices shown]
[im 19/55  brain]
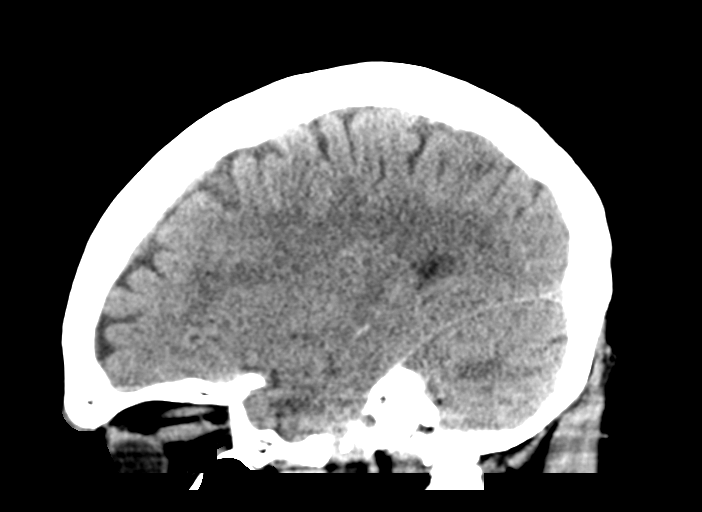
[im 28/55  brain]
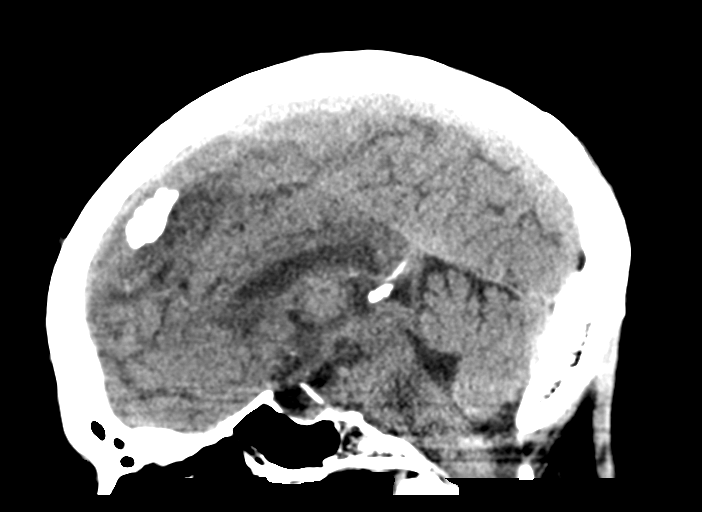
[im 37/55  brain]
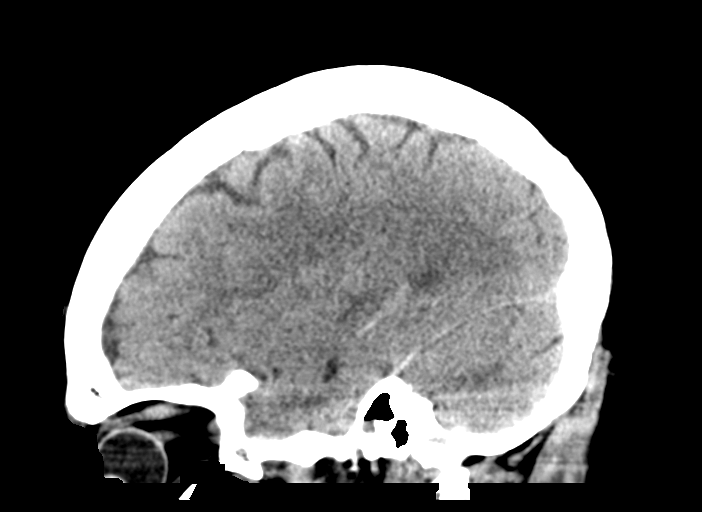

[15 of 47 positions shown; findings below may reference images not displayed]

FINDINGS: Brain:

No evidence of large-territorial acute infarction. No parenchymal
hemorrhage. No mass lesion. No extra-axial collection.

No mass effect or midline shift. No hydrocephalus. Basilar cisterns
are patent.

Vascular: No hyperdense vessel.

Skull: No acute fracture or focal lesion.

Sinuses/Orbits: Paranasal sinuses and mastoid air cells are clear.
The orbits are unremarkable.

Other: None.
IMPRESSION: No acute intracranial abnormality. Please note MRI brain with
contrast is a much more sensitive evaluation for metastases.

## 2021-10-27 ENCOUNTER — Ambulatory Visit (AMBULATORY_SURGERY_CENTER): Payer: Medicare HMO | Admitting: *Deleted

## 2021-10-27 VITALS — Ht 64.0 in | Wt 171.0 lb

## 2021-10-27 DIAGNOSIS — Z1211 Encounter for screening for malignant neoplasm of colon: Secondary | ICD-10-CM

## 2021-10-27 MED ORDER — PEG 3350-KCL-NA BICARB-NACL 420 G PO SOLR: 4000.0000 mL | Freq: Once | ORAL | 0 refills | Status: AC

## 2021-10-27 NOTE — Progress Notes (Signed)
Patient's pre-visit was done today over the phone with the patient. Name,DOB and address verified. Patient denies any allergies to Eggs and Soy. Patient denies any problems with anesthesia/sedation. Patient is not taking any diet pills or blood thinners. No home Oxygen. Insurance confirmed with patient. ? ?Prep instructions sent to pt's MyChart (if available) & mailed to pt-pt is aware. Patient understands to call us back with any questions or concerns. Patient is aware of our care-partner policy.  ? ?EMMI education assigned to the patient for the procedure, sent to [place].  ? ?The patient is COVID-19 vaccinated.   ?

## 2021-11-03 ENCOUNTER — Encounter: Payer: Self-pay | Admitting: Gastroenterology

## 2021-11-10 ENCOUNTER — Ambulatory Visit (AMBULATORY_SURGERY_CENTER): Payer: Medicare HMO | Admitting: Gastroenterology

## 2021-11-10 ENCOUNTER — Encounter: Payer: Self-pay | Admitting: Gastroenterology

## 2021-11-10 VITALS — BP 120/82 | HR 71 | Temp 98.2°F | Resp 17 | Ht 64.0 in | Wt 171.0 lb

## 2021-11-10 DIAGNOSIS — Z1211 Encounter for screening for malignant neoplasm of colon: Secondary | ICD-10-CM

## 2021-11-10 DIAGNOSIS — E119 Type 2 diabetes mellitus without complications: Secondary | ICD-10-CM | POA: Diagnosis not present

## 2021-11-10 MED ORDER — SODIUM CHLORIDE 0.9 % IV SOLN: 500.0000 mL | Freq: Once | INTRAVENOUS | Status: DC

## 2021-11-10 NOTE — Patient Instructions (Signed)
Handouts on diverticulosis given to patient. ?No repeat colonoscopy for surveillance unless new symptoms arise! ? ? ?YOU HAD AN ENDOSCOPIC PROCEDURE TODAY AT [facility]:   Refer to the procedure report that was given to you for any specific questions about what was found during the examination.  If the procedure report does not answer your questions, please call your gastroenterologist to clarify.  If you requested that your care partner not be given the details of your procedure findings, then the procedure report has been included in a sealed envelope for you to review at your convenience later. ? ?YOU SHOULD EXPECT: Some feelings of bloating in the abdomen. Passage of more gas than usual.  Walking can help get rid of the air that was put into your GI tract during the procedure and reduce the bloating. If you had a lower endoscopy (such as a colonoscopy or flexible sigmoidoscopy) you may notice spotting of blood in your stool or on the toilet paper. If you underwent a bowel prep for your procedure, you may not have a normal bowel movement for a few days. ? ?Please Note:  You might notice some irritation and congestion in your nose or some drainage.  This is from the oxygen used during your procedure.  There is no need for concern and it should clear up in a day or so. ? ?SYMPTOMS TO REPORT IMMEDIATELY: ? ?Following lower endoscopy (colonoscopy or flexible sigmoidoscopy): ? Excessive amounts of blood in the stool ? Significant tenderness or worsening of abdominal pains ? Swelling of the abdomen that is new, acute ? Fever of 100?F or higher ? ?For urgent or emergent issues, a gastroenterologist can be reached at any hour by calling [phone]. ?Do not use MyChart messaging for urgent concerns.  ? ? ?DIET:  We do recommend a small meal at first, but then you may proceed to your regular diet.  Drink plenty of fluids but you should avoid alcoholic beverages for 24 hours. ? ?ACTIVITY:  You should  plan to take it easy for the rest of today and you should NOT DRIVE or use heavy machinery until tomorrow (because of the sedation medicines used during the test).   ? ?FOLLOW UP: ?Our staff will call the number listed on your records 48-72 hours following your procedure to check on you and address any questions or concerns that you may have regarding the information given to you following your procedure. If we do not reach you, we will leave a message.  We will attempt to reach you two times.  During this call, we will ask if you have developed any symptoms of COVID 19. If you develop any symptoms (ie: fever, flu-like symptoms, shortness of breath, cough etc.) before then, please call [phone].  If you test positive for Covid 19 in the 2 weeks post procedure, please call and report this information to [place].   ? ?If any biopsies were taken you will be contacted by phone or by letter within the next 1-3 weeks.  Please call us at [phone] if you have not heard about the biopsies in 3 weeks.  ? ? ?SIGNATURES/CONFIDENTIALITY: ?You and/or your care partner have signed paperwork which will be entered into your electronic medical record.  These signatures attest to the fact that that the information above on your After Visit Summary has been reviewed and is understood.  Full responsibility of the confidentiality of this discharge information lies with you and/or your care-partner.  ?

## 2021-11-10 NOTE — Op Note (Signed)
[place] ?Patient Name: [name] [name] ?Procedure Date: 11/10/2021 11:04 AM ?MRN: [id number] ?Endoscopist: Ladene Artist , MD ?Age: 71 ?Referring MD:  ?Date of Birth: [birth year] ?Gender: Female ?Account #: [id number] ?Procedure:                Colonoscopy ?Indications:              Screening for colorectal malignant neoplasm ?Medicines:                Monitored Anesthesia Care ?Procedure:                Pre-Anesthesia Assessment: ?                          - Prior to the procedure, a History and Physical  ?                          was performed, and patient medications and  ?                          allergies were reviewed. The patient's tolerance of  ?                          previous anesthesia was also reviewed. The risks  ?                          and benefits of the procedure and the sedation  ?                          options and risks were discussed with the patient.  ?                          All questions were answered, and informed consent  ?                          was obtained. Prior Anticoagulants: The patient has  ?                          taken no previous anticoagulant or antiplatelet  ?                          agents. ASA Grade Assessment: II - A patient with  ?                          mild systemic disease. After reviewing the risks  ?                          and benefits, the patient was deemed in  ?                          satisfactory condition to undergo the procedure. ?                          After obtaining informed consent, the colonoscope  ?  was passed under direct vision. Throughout the  ?                          procedure, the patient's blood pressure, pulse, and  ?                          oxygen saturations were monitored continuously. The  ?                          Olympus [id number]-[id number] (#[id number]) Colonoscope was  ?                          introduced through the anus and advanced to the the  ?                          cecum,  identified by appendiceal orifice and  ?                          ileocecal valve. The ileocecal valve, appendiceal  ?                          orifice, and rectum were photographed. The quality  ?                          of the bowel preparation was good. The colonoscopy  ?                          was performed without difficulty. The patient  ?                          tolerated the procedure well. ?Scope In: 11:07:42 AM ?Scope Out: 11:24:24 AM ?Scope Withdrawal Time: 0 hours 12 minutes 49 seconds  ?Total Procedure Duration: 0 hours 16 minutes 42 seconds  ?Findings:                 The perianal and digital rectal examinations were  ?                          normal. ?                          A few small-mouthed diverticula were found in the  ?                          left colon. There was no evidence of diverticular  ?                          bleeding. ?                          The exam was otherwise without abnormality on  ?                          direct and retroflexion views. ?Complications:            No immediate complications. Estimated blood  loss:  ?                          None. ?Estimated Blood Loss:     Estimated blood loss: none. ?Impression:               - Mild diverticulosis in the left colon. ?                          - The examination was otherwise normal on direct  ?                          and retroflexion views. ?                          - No specimens collected. ?Recommendation:           - Patient has a contact number available for  ?                          emergencies. The signs and symptoms of potential  ?                          delayed complications were discussed with the  ?                          patient. Return to normal activities tomorrow.  ?                          Written discharge instructions were provided to the  ?                          patient. ?                          - Resume previous diet. ?                          - Continue present medications. ?                           - No repeat colonoscopy due to age and the absence  ?                          of colonic polyps. ?Ladene Artist, MD ?11/10/2021 11:28:20 AM ?This report has been signed electronically. ?

## 2021-11-10 NOTE — Progress Notes (Signed)
? ?History & Physical ? ?Primary Care Physician:  Kelton Pillar, MD ?Primary Gastroenterologist: Lucio Edward, MD ? ?CHIEF COMPLAINT:  CRC screening ? ? ?HPI: [name] [name] is a 71 y.o. female for CRC screening, average risk, with colonoscopy. ? ? ?Past Medical History:  ?Diagnosis Date  ? Diabetes mellitus without complication ([place])   ? Recurrent infiltrating ductal carcinoma of breast [facility]) 1999  ? 1999 and 2006  ? ? ?Past Surgical History:  ?Procedure Laterality Date  ? COLONOSCOPY  08/13/2010  ? Dr.Havanna Groner-normal  ? LEFT HEART CATH AND CORONARY ANGIOGRAPHY N/A 06/19/2019  ? Procedure: LEFT HEART CATH AND CORONARY ANGIOGRAPHY;  Surgeon: Dixie Dials, MD;  Location: [facility];  Service: Cardiovascular;  Laterality: N/A;  ? MASTECTOMY Left 2006  ? ? ?Prior to Admission medications   ?Medication Sig Start Date End Date Taking? Authorizing Provider  ?aspirin EC 81 MG tablet Take 81 mg by mouth daily.   Yes [provider]  ?atorvastatin (LIPITOR) 20 MG tablet 1 tablet 09/25/21  Yes [provider]  ?cholecalciferol (VITAMIN D) 1000 UNITS tablet Take 1,000 Units by mouth 2 (two) times daily.    Yes [provider]  ?Cyanocobalamin (VITAMIN B 12 PO) Take 1 tablet by mouth daily.   Yes [provider]  ?metFORMIN (GLUCOPHAGE) 1000 MG tablet Take 1,000 mg by mouth 2 (two) times daily with a meal.   Yes [provider]  ?TRULICITY 6.50 PT/4.6FK SOPN Inject into the skin. 08/20/21  Yes [provider]  ?EPINEPHrine 0.15 MG/0.15ML IJ injection Inject 0.15 mLs (0.15 mg total) into the muscle as needed for anaphylaxis. ?Patient not taking: Reported on 10/27/2021 04/03/18   Blanchie Dessert, MD  ?nitroGLYCERIN (NITROSTAT) 0.4 MG SL tablet Place 1 tablet (0.4 mg total) under the tongue every 5 (five) minutes x 3 doses as needed for chest pain. ?Patient not taking: Reported on 10/27/2021 06/20/19   Dixie Dials, MD  ? ? ?Current Outpatient Medications   ?Medication Sig Dispense Refill  ? aspirin EC 81 MG tablet Take 81 mg by mouth daily.    ? atorvastatin (LIPITOR) 20 MG tablet 1 tablet    ? cholecalciferol (VITAMIN D) 1000 UNITS tablet Take 1,000 Units by mouth 2 (two) times daily.     ? Cyanocobalamin (VITAMIN B 12 PO) Take 1 tablet by mouth daily.    ? metFORMIN (GLUCOPHAGE) 1000 MG tablet Take 1,000 mg by mouth 2 (two) times daily with a meal.    ? TRULICITY 8.12 XN/1.7GY SOPN Inject into the skin.    ? EPINEPHrine 0.15 MG/0.15ML IJ injection Inject 0.15 mLs (0.15 mg total) into the muscle as needed for anaphylaxis. (Patient not taking: Reported on 10/27/2021) 2 Device 1  ? nitroGLYCERIN (NITROSTAT) 0.4 MG SL tablet Place 1 tablet (0.4 mg total) under the tongue every 5 (five) minutes x 3 doses as needed for chest pain. (Patient not taking: Reported on 10/27/2021) 25 tablet 1  ? ?Current Facility-Administered Medications  ?Medication Dose Route Frequency Provider Last Rate Last Admin  ? 0.9 %  sodium chloride infusion  500 mL Intravenous Once Ladene Artist, MD      ? ? ?Allergies as of 11/10/2021 - Review Complete 11/10/2021  ?Allergen Reaction Noted  ? Bee venom Anaphylaxis 05/27/2021  ? Empagliflozin Other (See Comments) 05/27/2021  ? ? ?Family History  ?Problem Relation Age of Onset  ? Colon cancer Neg Hx   ? Colon polyps Neg Hx   ? Esophageal cancer Neg Hx   ?  Rectal cancer Neg Hx   ? Stomach cancer Neg Hx   ? ? ?Social History  ? ?Socioeconomic History  ? Marital status: Divorced  ?  Spouse name: Not on file  ? Number of children: Not on file  ? Years of education: Not on file  ? Highest education level: Not on file  ?Occupational History  ? Not on file  ?Tobacco Use  ? Smoking status: Never  ? Smokeless tobacco: Never  ? Tobacco comments:  ?  never used product  ?Vaping Use  ? Vaping Use: Never used  ?Substance and Sexual Activity  ? Alcohol use: No  ? Drug use: No  ? Sexual activity: Not Currently  ?Other Topics Concern  ? Not on file  ?Social History  Narrative  ? Not on file  ? ?Social Determinants of Health  ? ?Financial Resource Strain: Not on file  ?Food Insecurity: Not on file  ?Transportation Needs: Not on file  ?Physical Activity: Not on file  ?Stress: Not on file  ?Social Connections: Not on file  ?Intimate Partner Violence: Not on file  ? ? ?Review of Systems: ? ?All systems reviewed an negative except where noted in HPI. ? ?Gen: Denies any fever, chills, sweats, anorexia, fatigue, weakness, malaise, weight loss, and sleep disorder ?CV: Denies chest pain, angina, palpitations, syncope, orthopnea, PND, peripheral edema, and claudication. ?Resp: Denies dyspnea at rest, dyspnea with exercise, cough, sputum, wheezing, coughing up blood, and pleurisy. ?GI: Denies vomiting blood, jaundice, and fecal incontinence.   Denies dysphagia or odynophagia. ?GU : Denies urinary burning, blood in urine, urinary frequency, urinary hesitancy, nocturnal urination, and urinary incontinence. ?MS: Denies joint pain, limitation of movement, and swelling, stiffness, low back pain, extremity pain. Denies muscle weakness, cramps, atrophy.  ?Derm: Denies rash, itching, dry skin, hives, moles, warts, or unhealing ulcers.  ?Psych: Denies depression, anxiety, memory loss, suicidal ideation, hallucinations, paranoia, and confusion. ?Heme: Denies bruising, bleeding, and enlarged lymph nodes. ?Neuro:  Denies any headaches, dizziness, paresthesias. ?Endo:  Denies any problems with DM, thyroid, adrenal function. ? ? ?Physical Exam: ?General:  Alert, well-developed, in NAD ?Head:  Normocephalic and atraumatic. ?Eyes:  Sclera clear, no icterus.   Conjunctiva pink. ?Ears:  Normal auditory acuity. ?Mouth:  No deformity or lesions.  ?Neck:  Supple; no masses . ?Lungs:  Clear throughout to auscultation.   No wheezes, crackles, or rhonchi. No acute distress. ?Heart:  Regular rate and rhythm; no murmurs. ?Abdomen:  Soft, nondistended, nontender. No masses, hepatomegaly. No obvious masses.  Normal  bowel .    ?Rectal:  Deferred   ?Msk:  Symmetrical without gross deformities.Marland Kitchen ?Pulses:  Normal pulses noted. ?Extremities:  Without edema. ?Neurologic:  Alert and  oriented x4;  grossly normal neurologically. ?Skin:  Intact without significant lesions or rashes. ?Cervical Nodes:  No significant cervical adenopathy. ?Psych:  Alert and cooperative. Normal mood and affect. ? ? ?Impression / Plan:  ? ?CRC screening, average risk, for colonoscopy. ? ?Yukiko Minnich T. Fuller Plan  11/10/2021, 10:58 AM ?See Shea Evans, [facility], to contact our on call provider ? ? ?  ?

## 2021-11-10 NOTE — Progress Notes (Signed)
To pacu, vss. Report to [email or web address] 

## 2021-11-10 NOTE — Progress Notes (Signed)
Pt's states no medical or surgical changes since previsit or office visit. 

## 2021-11-12 ENCOUNTER — Telehealth: Payer: Self-pay | Admitting: *Deleted

## 2021-11-12 NOTE — Telephone Encounter (Signed)
?  Follow up Call- ? ? ?  11/10/2021  ? 10:33 AM  ?Call back number  ?Post procedure Call Back phone  # [phone]  ?Permission to leave phone message Yes  ?  ? ?Patient questions: ? ?Do you have a fever, pain , or abdominal swelling? No. ?Pain Score  0 * ? ?Have you tolerated food without any problems? Yes.   ? ?Have you been able to return to your normal activities? Yes.   ? ?Do you have any questions about your discharge instructions: ?Diet   No. ?Medications  No. ?Follow up visit  No. ? ?Do you have questions or concerns about your Care? No. ? ?Actions: ?* If pain score is 4 or above: ?No action needed, pain <4. ? ? ?

## 2021-11-25 DIAGNOSIS — E7800 Pure hypercholesterolemia, unspecified: Secondary | ICD-10-CM | POA: Diagnosis not present

## 2021-11-25 DIAGNOSIS — I2510 Atherosclerotic heart disease of native coronary artery without angina pectoris: Secondary | ICD-10-CM | POA: Diagnosis not present

## 2021-11-25 DIAGNOSIS — E1169 Type 2 diabetes mellitus with other specified complication: Secondary | ICD-10-CM | POA: Diagnosis not present

## 2021-12-23 DIAGNOSIS — H8113 Benign paroxysmal vertigo, bilateral: Secondary | ICD-10-CM | POA: Diagnosis not present

## 2021-12-23 DIAGNOSIS — H6123 Impacted cerumen, bilateral: Secondary | ICD-10-CM | POA: Diagnosis not present

## 2021-12-23 DIAGNOSIS — R110 Nausea: Secondary | ICD-10-CM | POA: Diagnosis not present

## 2022-03-19 DIAGNOSIS — M1A9XX1 Chronic gout, unspecified, with tophus (tophi): Secondary | ICD-10-CM | POA: Diagnosis not present

## 2022-03-26 ENCOUNTER — Other Ambulatory Visit: Payer: Self-pay | Admitting: Physician Assistant

## 2022-03-26 ENCOUNTER — Ambulatory Visit
Admission: RE | Admit: 2022-03-26 | Discharge: 2022-03-26 | Disposition: A | Discharge status: Home / Self Care | Payer: Medicare Other | Source: Ambulatory Visit | Attending: Physician Assistant | Admitting: Physician Assistant

## 2022-03-26 DIAGNOSIS — M79672 Pain in left foot: Secondary | ICD-10-CM

## 2022-04-09 ENCOUNTER — Other Ambulatory Visit: Payer: Self-pay | Admitting: Family Medicine

## 2022-04-09 DIAGNOSIS — Z1231 Encounter for screening mammogram for malignant neoplasm of breast: Secondary | ICD-10-CM

## 2022-04-13 DIAGNOSIS — H8113 Benign paroxysmal vertigo, bilateral: Secondary | ICD-10-CM | POA: Diagnosis not present

## 2022-04-13 DIAGNOSIS — M1A9XX1 Chronic gout, unspecified, with tophus (tophi): Secondary | ICD-10-CM | POA: Diagnosis not present

## 2022-04-16 ENCOUNTER — Other Ambulatory Visit: Payer: Self-pay | Admitting: Physician Assistant

## 2022-04-16 DIAGNOSIS — N644 Mastodynia: Secondary | ICD-10-CM | POA: Diagnosis not present

## 2022-04-16 DIAGNOSIS — Z853 Personal history of malignant neoplasm of breast: Secondary | ICD-10-CM | POA: Diagnosis not present

## 2022-04-16 DIAGNOSIS — Z9010 Acquired absence of unspecified breast and nipple: Secondary | ICD-10-CM | POA: Diagnosis not present

## 2022-04-27 ENCOUNTER — Ambulatory Visit
Admission: RE | Admit: 2022-04-27 | Discharge: 2022-04-27 | Disposition: A | Discharge status: Home / Self Care | Payer: Medicare Other | Source: Ambulatory Visit | Attending: Physician Assistant | Admitting: Physician Assistant

## 2022-04-27 DIAGNOSIS — R92321 Mammographic fibroglandular density, right breast: Secondary | ICD-10-CM | POA: Diagnosis not present

## 2022-04-27 DIAGNOSIS — N6489 Other specified disorders of breast: Secondary | ICD-10-CM | POA: Diagnosis not present

## 2022-04-27 DIAGNOSIS — N644 Mastodynia: Secondary | ICD-10-CM

## 2022-06-19 ENCOUNTER — Ambulatory Visit
Admission: EM | Admit: 2022-06-19 | Discharge: 2022-06-19 | Disposition: A | Discharge status: Home / Self Care | Payer: Medicare Other | Attending: Physician Assistant | Admitting: Physician Assistant

## 2022-06-19 DIAGNOSIS — Z1152 Encounter for screening for COVID-19: Secondary | ICD-10-CM | POA: Diagnosis not present

## 2022-06-19 DIAGNOSIS — J069 Acute upper respiratory infection, unspecified: Secondary | ICD-10-CM

## 2022-06-19 NOTE — ED Triage Notes (Signed)
Pt presents to [facility] with co of sore throat and congestion and runny nose. Neg cov id at home. Pt reports symptoms started yesterday

## 2022-06-19 NOTE — ED Provider Notes (Signed)
[facility]    CSN: [id number] Arrival date & time: 06/19/22  1506      History   Chief Complaint Chief Complaint  Patient presents with   Sore Throat    HPI [name] [name] is a 71 y.o. female.   Patient here today for evaluation of cough, congestion, sore throat that started yesterday.  She has not had fever that she is aware of.  She states she took a at home COVID test that was negative.  She denies any nausea, vomiting or diarrhea.  She is only taking cough drops for symptom relief.  The history is provided by the patient.  Sore Throat Pertinent negatives include no shortness of breath.    Past Medical History:  Diagnosis Date   Diabetes mellitus without complication ([place])    Recurrent infiltrating ductal carcinoma of breast ([place]) 1999   1999 and 2006    Patient Active Problem List   Diagnosis Date Noted   Acute coronary syndrome ([place]) 06/18/2019   Recurrent infiltrating ductal carcinoma of breast ([place]) 06/18/2011    Past Surgical History:  Procedure Laterality Date   COLONOSCOPY  08/13/2010   Dr.Stark-normal   LEFT HEART CATH AND CORONARY ANGIOGRAPHY N/A 06/19/2019   Procedure: LEFT HEART CATH AND CORONARY ANGIOGRAPHY;  Surgeon: Dixie Dials, MD;  Location: [facility];  Service: Cardiovascular;  Laterality: N/A;   MASTECTOMY Left 2006    OB History   No obstetric history on file.      Home Medications    Prior to Admission medications   Medication Sig Start Date End Date Taking? Authorizing Provider  aspirin EC 81 MG tablet Take 81 mg by mouth daily.    [provider]  atorvastatin (LIPITOR) 20 MG tablet 1 tablet 09/25/21   [provider]  cholecalciferol (VITAMIN D) 1000 UNITS tablet Take 1,000 Units by mouth 2 (two) times daily.     [provider]  Cyanocobalamin (VITAMIN B 12 PO) Take 1 tablet by mouth daily.    [provider]  EPINEPHrine 0.15 MG/0.15ML IJ injection Inject  0.15 mLs (0.15 mg total) into the muscle as needed for anaphylaxis. Patient not taking: Reported on 10/27/2021 04/03/18   Blanchie Dessert, MD  metFORMIN (GLUCOPHAGE) 1000 MG tablet Take 1,000 mg by mouth 2 (two) times daily with a meal.    [provider]  nitroGLYCERIN (NITROSTAT) 0.4 MG SL tablet Place 1 tablet (0.4 mg total) under the tongue every 5 (five) minutes x 3 doses as needed for chest pain. Patient not taking: Reported on 10/27/2021 06/20/19   Dixie Dials, MD  TRULICITY 4.19 FX/9.0WI SOPN Inject into the skin. 08/20/21   [provider]    Family History Family History  Problem Relation Age of Onset   Breast cancer Sister    Colon cancer Neg Hx    Colon polyps Neg Hx    Esophageal cancer Neg Hx    Rectal cancer Neg Hx    Stomach cancer Neg Hx     Social History Social History   Tobacco Use   Smoking status: Never   Smokeless tobacco: Never   Tobacco comments:    never used product  Vaping Use   Vaping Use: Never used  Substance Use Topics   Alcohol use: No   Drug use: No     Allergies   Bee venom and Empagliflozin   Review of Systems Review of Systems  Constitutional:  Negative for chills and fever.  HENT:  Positive for congestion and sore throat. Negative for ear pain.   Eyes:  Negative for discharge and redness.  Respiratory:  Positive for cough. Negative for shortness of breath and wheezing.   Gastrointestinal:  Negative for diarrhea, nausea and vomiting.     Physical Exam Triage Vital Signs ED Triage Vitals  Enc Vitals Group     BP 06/19/22 1554 117/75     Pulse Rate 06/19/22 1554 90     Resp 06/19/22 1554 16     Temp 06/19/22 1554 99.5 F (37.5 C)     Temp Source 06/19/22 1554 Oral     SpO2 06/19/22 1554 98 %     Weight --      Height --      Head Circumference --      Peak Flow --      Pain Score 06/19/22 1553 9     Pain Loc --      Pain Edu? --      Excl. in [place]? --    No data found.  Updated Vital Signs BP  117/75   Pulse 90   Temp 99.5 F (37.5 C) (Oral)   Resp 16   SpO2 98%      Physical Exam Vitals and nursing note reviewed.  Constitutional:      General: She is not in acute distress.    Appearance: Normal appearance. She is not ill-appearing.  HENT:     Head: Normocephalic and atraumatic.     Nose: Congestion present.     Mouth/Throat:     Mouth: Mucous membranes are moist.     Pharynx: No oropharyngeal exudate or posterior oropharyngeal erythema.  Eyes:     Conjunctiva/sclera: Conjunctivae normal.  Cardiovascular:     Rate and Rhythm: Normal rate and regular rhythm.     Heart sounds: Normal heart sounds. No murmur heard. Pulmonary:     Effort: Pulmonary effort is normal. No respiratory distress.     Breath sounds: Normal breath sounds. No wheezing, rhonchi or rales.  Skin:    General: Skin is warm and dry.  Neurological:     Mental Status: She is alert.  Psychiatric:        Mood and Affect: Mood normal.        Thought Content: Thought content normal.      UC Treatments / Results  Labs (all labs ordered are listed, but only abnormal results are displayed) Labs Reviewed  SARS CORONAVIRUS 2 (TAT 6-24 HRS)    EKG   Radiology No results found.  Procedures Procedures (including critical care time)  Medications Ordered in UC Medications - No data to display  Initial Impression / Assessment and Plan / UC Course  I have reviewed the triage vital signs and the nursing notes.  Pertinent labs & imaging results that were available during my care of the patient were reviewed by me and considered in my medical decision making (see chart for details).    Suspect viral etiology of symptoms.  Will order COVID screening.  Recommended continued symptomatic treatment while awaiting results.  Encouraged follow-up with any worsening symptoms or further concerns  Final Clinical Impressions(s) / UC Diagnoses   Final diagnoses:  Acute upper respiratory infection   Encounter for screening for COVID-19   Discharge Instructions   None    ED Prescriptions   None    PDMP not reviewed this encounter.   Francene Finders, PA-C 06/19/22 [phone]

## 2022-06-20 LAB — SARS CORONAVIRUS 2 (TAT 6-24 HRS): SARS Coronavirus 2: POSITIVE — AB

## 2022-06-24 DIAGNOSIS — B349 Viral infection, unspecified: Secondary | ICD-10-CM | POA: Diagnosis not present

## 2022-06-24 DIAGNOSIS — R0981 Nasal congestion: Secondary | ICD-10-CM | POA: Diagnosis not present

## 2022-07-31 DIAGNOSIS — E559 Vitamin D deficiency, unspecified: Secondary | ICD-10-CM | POA: Diagnosis not present

## 2022-07-31 DIAGNOSIS — Z79899 Other long term (current) drug therapy: Secondary | ICD-10-CM | POA: Diagnosis not present

## 2022-07-31 DIAGNOSIS — C50912 Malignant neoplasm of unspecified site of left female breast: Secondary | ICD-10-CM | POA: Diagnosis not present

## 2022-07-31 DIAGNOSIS — Z1159 Encounter for screening for other viral diseases: Secondary | ICD-10-CM | POA: Diagnosis not present

## 2022-07-31 DIAGNOSIS — Z0000 Encounter for general adult medical examination without abnormal findings: Secondary | ICD-10-CM | POA: Diagnosis not present

## 2022-07-31 DIAGNOSIS — I509 Heart failure, unspecified: Secondary | ICD-10-CM | POA: Diagnosis not present

## 2022-07-31 DIAGNOSIS — E785 Hyperlipidemia, unspecified: Secondary | ICD-10-CM | POA: Diagnosis not present

## 2022-07-31 DIAGNOSIS — I700 Atherosclerosis of aorta: Secondary | ICD-10-CM | POA: Diagnosis not present

## 2022-07-31 DIAGNOSIS — E1151 Type 2 diabetes mellitus with diabetic peripheral angiopathy without gangrene: Secondary | ICD-10-CM | POA: Diagnosis not present

## 2022-07-31 DIAGNOSIS — I25118 Atherosclerotic heart disease of native coronary artery with other forms of angina pectoris: Secondary | ICD-10-CM | POA: Diagnosis not present

## 2022-07-31 DIAGNOSIS — Z23 Encounter for immunization: Secondary | ICD-10-CM | POA: Diagnosis not present

## 2022-08-07 DIAGNOSIS — H43812 Vitreous degeneration, left eye: Secondary | ICD-10-CM | POA: Diagnosis not present

## 2022-08-07 DIAGNOSIS — H2513 Age-related nuclear cataract, bilateral: Secondary | ICD-10-CM | POA: Diagnosis not present

## 2022-08-07 DIAGNOSIS — E119 Type 2 diabetes mellitus without complications: Secondary | ICD-10-CM | POA: Diagnosis not present

## 2022-08-07 DIAGNOSIS — H25013 Cortical age-related cataract, bilateral: Secondary | ICD-10-CM | POA: Diagnosis not present

## 2022-08-12 DIAGNOSIS — Z0000 Encounter for general adult medical examination without abnormal findings: Secondary | ICD-10-CM | POA: Diagnosis not present

## 2022-08-12 DIAGNOSIS — K766 Portal hypertension: Secondary | ICD-10-CM | POA: Diagnosis not present

## 2022-08-12 DIAGNOSIS — Z0001 Encounter for general adult medical examination with abnormal findings: Secondary | ICD-10-CM | POA: Diagnosis not present

## 2022-08-12 DIAGNOSIS — C50912 Malignant neoplasm of unspecified site of left female breast: Secondary | ICD-10-CM | POA: Diagnosis not present

## 2022-08-12 DIAGNOSIS — J8410 Pulmonary fibrosis, unspecified: Secondary | ICD-10-CM | POA: Diagnosis not present

## 2022-08-13 ENCOUNTER — Other Ambulatory Visit: Payer: Self-pay | Admitting: Nurse Practitioner

## 2022-08-13 DIAGNOSIS — K7460 Unspecified cirrhosis of liver: Secondary | ICD-10-CM

## 2022-08-13 DIAGNOSIS — K766 Portal hypertension: Secondary | ICD-10-CM

## 2022-08-19 ENCOUNTER — Ambulatory Visit
Admission: RE | Admit: 2022-08-19 | Discharge: 2022-08-19 | Disposition: A | Discharge status: Home / Self Care | Payer: 59 | Source: Ambulatory Visit | Attending: Nurse Practitioner | Admitting: Nurse Practitioner

## 2022-08-19 DIAGNOSIS — K766 Portal hypertension: Secondary | ICD-10-CM

## 2022-08-19 DIAGNOSIS — K7460 Unspecified cirrhosis of liver: Secondary | ICD-10-CM

## 2023-01-09 ENCOUNTER — Encounter (HOSPITAL_COMMUNITY): Payer: Self-pay

## 2023-01-09 ENCOUNTER — Emergency Department (HOSPITAL_COMMUNITY): Payer: 59

## 2023-01-09 ENCOUNTER — Emergency Department (HOSPITAL_COMMUNITY)
Admission: EM | Admit: 2023-01-09 | Discharge: 2023-01-09 | Disposition: A | Discharge status: Home / Self Care | Payer: 59 | Attending: Emergency Medicine | Admitting: Emergency Medicine

## 2023-01-09 ENCOUNTER — Other Ambulatory Visit: Payer: Self-pay

## 2023-01-09 DIAGNOSIS — E119 Type 2 diabetes mellitus without complications: Secondary | ICD-10-CM | POA: Insufficient documentation

## 2023-01-09 DIAGNOSIS — R519 Headache, unspecified: Secondary | ICD-10-CM | POA: Diagnosis not present

## 2023-01-09 DIAGNOSIS — Z7984 Long term (current) use of oral hypoglycemic drugs: Secondary | ICD-10-CM | POA: Insufficient documentation

## 2023-01-09 DIAGNOSIS — Z853 Personal history of malignant neoplasm of breast: Secondary | ICD-10-CM | POA: Insufficient documentation

## 2023-01-09 DIAGNOSIS — Z7982 Long term (current) use of aspirin: Secondary | ICD-10-CM | POA: Diagnosis not present

## 2023-01-09 DIAGNOSIS — I619 Nontraumatic intracerebral hemorrhage, unspecified: Secondary | ICD-10-CM

## 2023-01-09 DIAGNOSIS — I10 Essential (primary) hypertension: Secondary | ICD-10-CM

## 2023-01-09 HISTORY — DX: Acute ischemic heart disease, unspecified: I24.9

## 2023-01-09 LAB — CBC
HCT: 40 % (ref 36.0–46.0)
Hemoglobin: 13.4 g/dL (ref 12.0–15.0)
MCH: 31.1 pg (ref 26.0–34.0)
MCHC: 33.5 g/dL (ref 30.0–36.0)
MCV: 92.8 fL (ref 80.0–100.0)
Platelets: 224 10*3/uL (ref 150–400)
RBC: 4.31 MIL/uL (ref 3.87–5.11)
RDW: 13.2 % (ref 11.5–15.5)
WBC: 4 10*3/uL (ref 4.0–10.5)
nRBC: 0 % (ref 0.0–0.2)

## 2023-01-09 LAB — TROPONIN I (HIGH SENSITIVITY): Troponin I (High Sensitivity): 3 ng/L (ref <18)

## 2023-01-09 LAB — BASIC METABOLIC PANEL
Anion gap: 9 (ref 5–15)
BUN: 8 mg/dL (ref 8–23)
CO2: 26 mmol/L (ref 22–32)
Calcium: 9.9 mg/dL (ref 8.9–10.3)
Chloride: 103 mmol/L (ref 98–111)
Creatinine, Ser: 0.7 mg/dL (ref 0.44–1.00)
GFR, Estimated: >60 mL/min (ref >60)
Glucose, Bld: 150 mg/dL — ABNORMAL HIGH (ref 70–99)
Potassium: 3.8 mmol/L (ref 3.5–5.1)
Sodium: 138 mmol/L (ref 135–145)

## 2023-01-09 LAB — CBG MONITORING, ED: Glucose-Capillary: 145 mg/dL — ABNORMAL HIGH (ref 70–99)

## 2023-01-09 MED ORDER — METOCLOPRAMIDE HCL 5 MG/ML IJ SOLN: 5.0000 mg | Freq: Once | INTRAMUSCULAR | Status: DC

## 2023-01-09 MED ORDER — GADOBUTROL 1 MMOL/ML IV SOLN
7.0000 mL | Freq: Once | INTRAVENOUS | Status: AC | PRN
  Administered 2023-01-09: 7 mL via INTRAVENOUS

## 2023-01-09 MED ORDER — IOHEXOL 350 MG/ML SOLN
75.0000 mL | Freq: Once | INTRAVENOUS | Status: AC | PRN
  Administered 2023-01-09: 75 mL via INTRAVENOUS

## 2023-01-09 MED ORDER — LISINOPRIL 10 MG PO TABS: 10.0000 mg | ORAL_TABLET | Freq: Every day | ORAL | 0 refills | Status: AC

## 2023-01-09 MED ORDER — DIPHENHYDRAMINE HCL 50 MG/ML IJ SOLN: 25.0000 mg | Freq: Once | INTRAMUSCULAR | Status: DC

## 2023-01-09 MED ORDER — LACTATED RINGERS IV BOLUS
500.0000 mL | Freq: Once | INTRAVENOUS | Status: AC
  Administered 2023-01-09: 500 mL via INTRAVENOUS

## 2023-01-09 NOTE — ED Notes (Signed)
Patient transported to MRI 

## 2023-01-09 NOTE — ED Provider Notes (Signed)
Patient is a handoff from [place], [place]. Plan at time of handoff is to await results of MRI brain w & w/o contrast for evaluation of area on CT head concerning for subacute lacunar infarct vs hemorrhagic metastatic disease. Physical Exam  BP (!) 169/91   Pulse 81   Temp 97.9 F (36.6 C) (Oral)   Resp 17   Ht 5\' 4"  (1.626 m)   Wt 69.4 kg   SpO2 100%   BMI 26.26 kg/m   Physical Exam Vitals and nursing note reviewed.  Constitutional:      General: She is not in acute distress.    Appearance: She is well-developed.  HENT:     Head: Normocephalic and atraumatic.  Eyes:     Conjunctiva/sclera: Conjunctivae normal.  Cardiovascular:     Rate and Rhythm: Normal rate and regular rhythm.     Heart sounds: No murmur heard. Pulmonary:     Effort: Pulmonary effort is normal. No respiratory distress.     Breath sounds: Normal breath sounds.  Abdominal:     Palpations: Abdomen is soft.     Tenderness: There is no abdominal tenderness.  Musculoskeletal:        General: No swelling.     Cervical back: Neck supple.  Skin:    General: Skin is warm and dry.     Capillary Refill: Capillary refill takes less than 2 seconds.  Neurological:     Mental Status: She is alert and oriented to person, place, and time.     Comments: Mental Status:  Alert, thought content appropriate, able to give a coherent history. Speech fluent without evidence of aphasia. Able to follow 2 step commands without difficulty.  Cranial Nerves:  II:  Peripheral visual fields grossly normal, pupils equal, round, reactive to light III,IV, VI: ptosis not present, extra-ocular motions intact bilaterally  V,VII: smile symmetric, facial light touch sensation equal VIII: hearing grossly normal to voice  X: uvula elevates symmetrically  XI: bilateral shoulder shrug symmetric and strong XII: midline tongue extension without fassiculations Motor:  Normal tone. 5/5 strength of BUE and BLE major muscle groups including strong and  equal grip strength and dorsiflexion/plantar flexion Sensory: light touch normal in all extremities. Cerebellar: normal finger-to-nose with bilateral upper extremities, Romberg sign absent Gait: Not accessed     Psychiatric:        Mood and Affect: Mood normal.     Procedures  Procedures  ED Course / MDM   Clinical Course as of 01/09/23 1540  Sat Jan 09, 2023  1435 Small lacunar infarct with retained blood product  [MB]    Clinical Course User Index [MB] Mare Ferrari, PA-C   Medical Decision Making Amount and/or Complexity of Data Reviewed Labs: ordered. Radiology: ordered.  Risk Prescription drug management.   Patient is a handoff from [place], [place]. Please see their note for full HPI and physical exam findings. Patient symptomatically improving with reduced pain in head, but CT head concerning for possible subacute lacunar infarct vs metastatic disease requiring evaluation with MRI brain. Dr. Otelia Limes has previously been contacted regarding this present.  MR brain appears to show a chronic hemorrhage within the right basal ganglia. Given appearance on MR, will consult with Dr. Amada Jupiter with neurology for further evaluation and recommendations regarding possible admission vs outpatient.   Dr. Amada Jupiter reviewed the images from MRI and CT imaging and feels that this is not acute in nature or likely related to the headache. Headache more likely to be due  to poor management of HTN as patient is not currently on any medications. Advised following up with PCP and neurology for further evaluation. Encouraged patient to return to the ED if symptoms worsen. Given history of poorly controlled BP, will start low dose Lisinopril and advised patient follow up with PCP for further evaluation. Patient agreeable with treatment plan and verbalized understanding all return precautions. All questions answered prior to patient discharge.      Smitty Knudsen, PA-C 01/09/23 1752     Charlynne Pander, MD 01/09/23 2230

## 2023-01-09 NOTE — ED Notes (Signed)
No focal neuro deficits noted Patient denies visual disturbances Says the room is spinning and she feels dizzy  Patient supposed to take metformin BID, says her CBG was 180 this morning so she took double dose of metformin

## 2023-01-09 NOTE — ED Provider Notes (Signed)
[facility] Provider Note   CSN: [id number] Arrival date & time: 01/09/23  1217     History  Chief Complaint  Patient presents with   Headache    [name] [name] is a 72 y.o. female.  HPI 72 year old female with a history of ductal carcinoma of the breast in remission, ACS, DM type II presents to the ER with complaints of severe 10 out of 10 headache with associated dizziness.  She states that headache started yesterday and is gradually gotten worse throughout the day.  She took Tylenol with EMS with little relief.  Denies any head injuries or falls.  Denies any loss of consciousness.  Denies any difficulty speaking, weakness, difficulty ambulating.  She does live alone.  Was otherwise in her normal state of health.  States that she checked her blood sugar at home and it was in the 180s, and this concerned her as normally her sugar runs in the 110s.  She takes metformin at home, states she took a second dose of metformin thinking this would bring down her blood sugar.  She does not take insulin.    Home Medications Prior to Admission medications   Medication Sig Start Date End Date Taking? Authorizing Provider  aspirin EC 81 MG tablet Take 81 mg by mouth daily.    [provider]  atorvastatin (LIPITOR) 20 MG tablet 1 tablet 09/25/21   [provider]  cholecalciferol (VITAMIN D) 1000 UNITS tablet Take 1,000 Units by mouth 2 (two) times daily.     [provider]  Cyanocobalamin (VITAMIN B 12 PO) Take 1 tablet by mouth daily.    [provider]  EPINEPHrine 0.15 MG/0.15ML IJ injection Inject 0.15 mLs (0.15 mg total) into the muscle as needed for anaphylaxis. Patient not taking: Reported on 10/27/2021 04/03/18   Gwyneth Sprout, MD  metFORMIN (GLUCOPHAGE) 1000 MG tablet Take 1,000 mg by mouth 2 (two) times daily with a meal.    [provider]  nitroGLYCERIN (NITROSTAT) 0.4 MG SL tablet  Place 1 tablet (0.4 mg total) under the tongue every 5 (five) minutes x 3 doses as needed for chest pain. Patient not taking: Reported on 10/27/2021 06/20/19   Orpah Cobb, MD  TRULICITY 0.75 MG/0.5ML SOPN Inject into the skin. 08/20/21   [provider]      Allergies    Bee venom and Empagliflozin    Review of Systems   Review of Systems Ten systems reviewed and are negative for acute change, except as noted in the HPI.   Physical Exam Updated Vital Signs BP (!) 147/79   Pulse 75   Temp 97.9 F (36.6 C) (Oral)   Resp 18   Ht 5\' 4"  (1.626 m)   Wt 69.4 kg   SpO2 98%   BMI 26.26 kg/m  Physical Exam Vitals and nursing note reviewed.  Constitutional:      General: She is not in acute distress.    Appearance: She is well-developed.  HENT:     Head: Normocephalic and atraumatic.  Eyes:     Conjunctiva/sclera: Conjunctivae normal.  Cardiovascular:     Rate and Rhythm: Normal rate and regular rhythm.     Heart sounds: No murmur heard. Pulmonary:     Effort: Pulmonary effort is normal. No respiratory distress.     Breath sounds: Normal breath sounds.  Abdominal:     Palpations: Abdomen is soft.     Tenderness: There is no abdominal tenderness.  Musculoskeletal:        General: No swelling.     Cervical back: Neck supple.  Skin:    General: Skin is warm and dry.     Capillary Refill: Capillary refill takes less than 2 seconds.  Neurological:     Mental Status: She is alert and oriented to person, place, and time.     Comments: Mental Status:  Alert, thought content appropriate, able to give a coherent history. Speech fluent without evidence of aphasia. Able to follow 2 step commands without difficulty.  Cranial Nerves:  II:  Peripheral visual fields grossly normal, pupils equal, round, reactive to light III,IV, VI: ptosis not present, extra-ocular motions intact bilaterally  V,VII: smile symmetric, facial light touch sensation equal VIII: hearing grossly normal  to voice  X: uvula elevates symmetrically  XI: bilateral shoulder shrug symmetric and strong XII: midline tongue extension without fassiculations Motor:  Normal tone. 5/5 strength of BUE and BLE major muscle groups including strong and equal grip strength and dorsiflexion/plantar flexion Sensory: light touch normal in all extremities. Cerebellar: normal finger-to-nose with bilateral upper extremities, Romberg sign absent Gait: Not accessed     Psychiatric:        Mood and Affect: Mood normal.     ED Results / Procedures / Treatments   Labs (all labs ordered are listed, but only abnormal results are displayed) Labs Reviewed  BASIC METABOLIC PANEL - Abnormal; Notable for the following components:      Result Value   Glucose, Bld 150 (*)    All other components within normal limits  CBG MONITORING, ED - Abnormal; Notable for the following components:   Glucose-Capillary 145 (*)    All other components within normal limits  CBC  TROPONIN I (HIGH SENSITIVITY)  TROPONIN I (HIGH SENSITIVITY)    EKG EKG Interpretation Date/Time:  Saturday January 09 2023 12:57:59 EDT Ventricular Rate:  80 PR Interval:  189 QRS Duration:  95 QT Interval:  404 QTC Calculation: 466 R Axis:   -29  Text Interpretation: Sinus rhythm Borderline left axis deviation Low voltage, precordial leads Abnormal R-wave progression, late transition Confirmed by Kristine Royal [phone]) on 01/09/2023 1:27:22 PM  Radiology CT Head Wo Contrast  Result Date: 01/09/2023 CLINICAL DATA:  Headache.  History of breast cancer EXAM: CT HEAD WITHOUT CONTRAST TECHNIQUE: Contiguous axial images were obtained from the base of the skull through the vertex without intravenous contrast. RADIATION DOSE REDUCTION: This exam was performed according to the departmental dose-optimization program which includes automated exposure control, adjustment of the mA and/or kV according to patient size and/or use of iterative reconstruction  technique. COMPARISON:  04/13/2020 FINDINGS: Brain: 15 x 8 mm area of focally decreased attenuation in the right basal ganglia with a small amount of surrounding high attenuation material (series 2, images 15-16). Minimal foci of increased attenuation within the contralateral left basal ganglia. No evidence of a large territory infarction. No extra-axial collection. No hydrocephalus. No mass effect or midline shift. Vascular: No hyperdense vessel or unexpected calcification. Skull: Normal. Negative for fracture or focal lesion. Sinuses/Orbits: No acute finding. Other: None. IMPRESSION: 1. There is a 15 x 8 mm area of focally decreased attenuation in the right basal ganglia with a small amount of surrounding high attenuation material. Differential includes subacute lacunar infarct with hemorrhagic component, hemorrhagic metastatic lesion given history of cancer, or a small subacute lacunar infarct with adjacent benign basal ganglia mineralization. Further evaluation with MRI of the brain with and without contrast is  recommended. 2. Minimal foci of increased attenuation within the contralateral left basal ganglia, nonspecific but likely representing a small amount of mineralization. These results were called by telephone at the time of interpretation on 01/09/2023 at 2:34 pm to provider [facility] , who verbally acknowledged these results. Electronically Signed   By: Duanne Guess D.O.   On: 01/09/2023 14:36   DG Chest Portable 1 View  Result Date: 01/09/2023 CLINICAL DATA:  Dizziness EXAM: PORTABLE CHEST 1 VIEW COMPARISON:  06/18/2019 FINDINGS: The heart size and mediastinal contours are within normal limits. Relatively increased density over the left lung base is likely secondary to soft tissue attenuation given previous left breast surgery. No focal airspace consolidation, pleural effusion, or pneumothorax. The visualized skeletal structures are unremarkable. IMPRESSION: No active disease. Electronically  Signed   By: Duanne Guess D.O.   On: 01/09/2023 13:46    Procedures Procedures    Medications Ordered in ED Medications  lactated ringers bolus 500 mL (500 mLs Intravenous New Bag/Given 01/09/23 1343)  iohexol (OMNIPAQUE) 350 MG/ML injection 75 mL (75 mLs Intravenous Contrast Given 01/09/23 1359)    ED Course/ Medical Decision Making/ A&P Clinical Course as of 01/09/23 1453  Sat Jan 09, 2023  1435 Small lacunar infarct with retained blood product  [MB]    Clinical Course User Index [MB] Mare Ferrari, PA-C                             Medical Decision Making Amount and/or Complexity of Data Reviewed Labs: ordered. Radiology: ordered.  Risk Prescription drug management.  72 year old female presents to the ER with headache and dizziness.  On arrival, she is well-appearing, no acute distress, resting on the ER bed.  Neurologic exam was overall reassuring.  She is slightly hypertensive with a blood pressure 143/78 but no other significant vital abnormalities noted.  Differential is broad but includes migraine/headache, stroke, intracranial bleed, malignancy  Labs ordered, reviewed, CBC unremarkable, BMP with a glucose of 150 but no other significant Electra abnormalities.  Initial troponin 3.  Glucose 145.  Chest x-ray ordered, reviewed, agree with radiology read, negative for acute findings.  EKG ordered, reviewed normal sinus rhythm.  CT of the head, as well as CT angio head and neck ordered.  Received a call from radiology, there is a 15 x 8 mm area of decreased attenuation in the right basal ganglia, differential includes subacute lacunar infarct with hemorrhagic component, hemorrhagic metastatic lesion or small subacute lacunar infarct.  Discussed with Dr. Otelia Limes, plan for MRI brain with and without contrast and reevaluate.  Patient may require admission.  I informed the patient of the findings.  She is agreeable to the MRI.  Care signed out to oncoming team will oversee her  imaging and disposition appropriately.  Case discussed with Dr. Rodena Medin who is agreeable to the plan and disposition.    Final Clinical Impression(s) / ED Diagnoses Final diagnoses:  Bad headache    Rx / DC Orders ED Discharge Orders     None         Leone Brand 01/09/23 1453    Wynetta Fines, MD 01/09/23 1526

## 2023-01-09 NOTE — ED Notes (Signed)
Patient transported to CT 

## 2023-01-09 NOTE — Discharge Instructions (Addendum)
You were seen in the emergency department for a headache. Given that your headache has resolved and your MRI of the brain was negative for any brain mass or acute stroke, you are safe for discharge home with follow up with your primary care provider and neurology. I spoke with Dr. Amada Jupiter, neurologist, regarding your case and he also feels that your results are likely due to a chronic hemorrhage from poor blood pressure control. For this reason, I have sent a prescription for lisinopril to your pharmacy to take but make sure you follow up with your primary care provider for further evaluation of your blood pressure at home. If you feel lightheaded while taking lisinopril, you can cut the tablet in a half for a smaller dose. Return to the ER if your symptoms return or worsen.

## 2023-02-02 ENCOUNTER — Other Ambulatory Visit: Payer: Self-pay

## 2023-02-02 ENCOUNTER — Ambulatory Visit
Admission: EM | Admit: 2023-02-02 | Discharge: 2023-02-02 | Disposition: A | Discharge status: Home / Self Care | Payer: 59 | Attending: Internal Medicine | Admitting: Internal Medicine

## 2023-02-02 ENCOUNTER — Encounter: Payer: Self-pay | Admitting: Emergency Medicine

## 2023-02-02 DIAGNOSIS — B349 Viral infection, unspecified: Secondary | ICD-10-CM | POA: Insufficient documentation

## 2023-02-02 DIAGNOSIS — Z20822 Contact with and (suspected) exposure to covid-19: Secondary | ICD-10-CM | POA: Insufficient documentation

## 2023-02-02 MED ORDER — FLUTICASONE PROPIONATE 50 MCG/ACT NA SUSP: 1.0000 | Freq: Every day | NASAL | 0 refills | Status: AC

## 2023-02-02 MED ORDER — BENZONATATE 100 MG PO CAPS: 100.0000 mg | ORAL_CAPSULE | Freq: Three times a day (TID) | ORAL | 0 refills | Status: DC | PRN

## 2023-02-02 NOTE — ED Triage Notes (Signed)
Pt sts cough with some body aches and nausea x 2 days

## 2023-02-02 NOTE — ED Provider Notes (Signed)
[facility]    CSN: [id number] Arrival date & time: 02/02/23  8295      History   Chief Complaint Chief Complaint  Patient presents with   Cough    HPI [name] [name] is a 72 y.o. female.   Patient presents with 2 to 3-day history of bodyaches, cough, nasal congestion, runny nose, nausea without vomiting, sore throat.  Patient reports that her family members recently tested positive for COVID and she has been taking them food but states that she has been very careful to avoid contact with them.  Denies any fever.  Denies chest pain, shortness of breath, diarrhea.  Has been able to drink fluids.  Has taken Claritin for symptoms.  Denies history of asthma or COPD and patient does not smoke cigarettes.   Cough   Past Medical History:  Diagnosis Date   Acute coronary syndrome (HCC)    Diabetes mellitus without complication (HCC)    Recurrent infiltrating ductal carcinoma of breast (HCC) 1999   1999 and 2006    Patient Active Problem List   Diagnosis Date Noted   Type 2 diabetes mellitus (HCC) 08/05/2019   Acute coronary syndrome (HCC) 06/18/2019   Recurrent infiltrating ductal carcinoma of breast (HCC) 06/18/2011    Past Surgical History:  Procedure Laterality Date   COLONOSCOPY  08/13/2010   Dr.Stark-normal   LEFT HEART CATH AND CORONARY ANGIOGRAPHY N/A 06/19/2019   Procedure: LEFT HEART CATH AND CORONARY ANGIOGRAPHY;  Surgeon: Orpah Cobb, MD;  Location: [facility];  Service: Cardiovascular;  Laterality: N/A;   MASTECTOMY Left 2006    OB History   No obstetric history on file.      Home Medications    Prior to Admission medications   Medication Sig Start Date End Date Taking? Authorizing Provider  benzonatate (TESSALON) 100 MG capsule Take 1 capsule (100 mg total) by mouth every 8 (eight) hours as needed for cough. 02/02/23  Yes Kennidy Lamke, Rolly Salter E, FNP  fluticasone (FLONASE) 50 MCG/ACT nasal spray Place 1 spray into both nostrils  daily. 02/02/23  Yes Ervin Knack E, FNP  aspirin EC 81 MG tablet Take 81 mg by mouth daily.    [provider]  atorvastatin (LIPITOR) 20 MG tablet 1 tablet 09/25/21   [provider]  cholecalciferol (VITAMIN D) 1000 UNITS tablet Take 1,000 Units by mouth 2 (two) times daily.     [provider]  Cyanocobalamin (VITAMIN B 12 PO) Take 1 tablet by mouth daily.    [provider]  EPINEPHrine 0.15 MG/0.15ML IJ injection Inject 0.15 mLs (0.15 mg total) into the muscle as needed for anaphylaxis. Patient not taking: Reported on 10/27/2021 04/03/18   Gwyneth Sprout, MD  lisinopril (ZESTRIL) 10 MG tablet Take 1 tablet (10 mg total) by mouth daily. 01/09/23 02/08/23  Smitty Knudsen, PA-C  metFORMIN (GLUCOPHAGE) 1000 MG tablet Take 1,000 mg by mouth 2 (two) times daily with a meal.    [provider]  nitroGLYCERIN (NITROSTAT) 0.4 MG SL tablet Place 1 tablet (0.4 mg total) under the tongue every 5 (five) minutes x 3 doses as needed for chest pain. Patient not taking: Reported on 10/27/2021 06/20/19   Orpah Cobb, MD  TRULICITY 0.75 MG/0.5ML SOPN Inject into the skin. 08/20/21   [provider]    Family History Family History  Problem Relation Age of Onset   Breast cancer Sister    Colon cancer Neg Hx    Colon polyps Neg Hx  Esophageal cancer Neg Hx    Rectal cancer Neg Hx    Stomach cancer Neg Hx     Social History Social History   Tobacco Use   Smoking status: Never   Smokeless tobacco: Never   Tobacco comments:    never used product  Vaping Use   Vaping status: Never Used  Substance Use Topics   Alcohol use: No   Drug use: No     Allergies   Bee venom and Empagliflozin   Review of Systems Review of Systems Per HPI  Physical Exam Triage Vital Signs ED Triage Vitals [02/02/23 1030]  Encounter Vitals Group     BP 127/80     Systolic BP Percentile      Diastolic BP Percentile      Pulse Rate 81     Resp 18     Temp  98.4 F (36.9 C)     Temp Source Oral     SpO2 95 %     Weight      Height      Head Circumference      Peak Flow      Pain Score 5     Pain Loc      Pain Education      Exclude from Growth Chart    No data found.  Updated Vital Signs BP 127/80 (BP Location: Left Arm)   Pulse 81   Temp 98.4 F (36.9 C) (Oral)   Resp 18   SpO2 95%   Visual Acuity Right Eye Distance:   Left Eye Distance:   Bilateral Distance:    Right Eye Near:   Left Eye Near:    Bilateral Near:     Physical Exam Constitutional:      General: She is not in acute distress.    Appearance: Normal appearance. She is not toxic-appearing or diaphoretic.  HENT:     Head: Normocephalic and atraumatic.     Right Ear: Tympanic membrane and ear canal normal.     Left Ear: Tympanic membrane and ear canal normal.     Nose: Congestion present.     Mouth/Throat:     Mouth: Mucous membranes are moist.     Pharynx: No posterior oropharyngeal erythema.  Eyes:     Extraocular Movements: Extraocular movements intact.     Conjunctiva/sclera: Conjunctivae normal.     Pupils: Pupils are equal, round, and reactive to light.  Cardiovascular:     Rate and Rhythm: Normal rate and regular rhythm.     Pulses: Normal pulses.     Heart sounds: Normal heart sounds.  Pulmonary:     Effort: Pulmonary effort is normal. No respiratory distress.     Breath sounds: Normal breath sounds. No stridor. No wheezing or rales.  Abdominal:     General: Abdomen is flat. Bowel sounds are normal. There is no distension.     Palpations: Abdomen is soft.     Tenderness: There is no abdominal tenderness.  Musculoskeletal:        General: Normal range of motion.     Cervical back: Normal range of motion.  Skin:    General: Skin is warm and dry.  Neurological:     General: No focal deficit present.     Mental Status: She is alert and oriented to person, place, and time. Mental status is at baseline.  Psychiatric:        Mood and Affect:  Mood normal.  Behavior: Behavior normal.      UC Treatments / Results  Labs (all labs ordered are listed, but only abnormal results are displayed) Labs Reviewed  SARS CORONAVIRUS 2 (TAT 6-24 HRS)    EKG   Radiology No results found.  Procedures Procedures (including critical care time)  Medications Ordered in UC Medications - No data to display  Initial Impression / Assessment and Plan / UC Course  I have reviewed the triage vital signs and the nursing notes.  Pertinent labs & imaging results that were available during my care of the patient were reviewed by me and considered in my medical decision making (see chart for details).     Patient presents with symptoms likely from a viral upper respiratory infection. Do not suspect underlying cardiopulmonary process. Symptoms seem unlikely related to ACS, CHF or COPD exacerbations, pneumonia, pneumothorax. Patient is nontoxic appearing and not in need of emergent medical intervention.  COVID test pending.  I am highly suspicious of COVID-19 given close exposure.  Recommended symptom control with medications.  Patient was sent Flonase and benzonatate to help alleviate symptoms.  Advised adequate fluid hydration and rest.  Return if symptoms fail to improve in 1-2 weeks or you develop shortness of breath, chest pain, severe headache. Patient states understanding and is agreeable.  Discharged with PCP followup.  Final Clinical Impressions(s) / UC Diagnoses   Final diagnoses:  Viral illness  Exposure to COVID-19 virus     Discharge Instructions      COVID test is pending.  Will call if it is positive.  As we discussed, your symptoms are viral and should run their course.  I have prescribed you 2 medications to alleviate symptoms.  Ensure you are drinking plenty of water and getting plenty of rest.  Follow-up if any symptoms persist or worsen.    ED Prescriptions     Medication Sig Dispense Auth. Provider    fluticasone (FLONASE) 50 MCG/ACT nasal spray Place 1 spray into both nostrils daily. 16 g Amirra Herling, Rolly Salter E, [place]   benzonatate (TESSALON) 100 MG capsule Take 1 capsule (100 mg total) by mouth every 8 (eight) hours as needed for cough. 21 capsule [place], Acie Fredrickson, [place]      PDMP not reviewed this encounter.   Gustavus Bryant, [place] 02/02/23 1053

## 2023-02-02 NOTE — Discharge Instructions (Signed)
COVID test is pending.  Will call if it is positive.  As we discussed, your symptoms are viral and should run their course.  I have prescribed you 2 medications to alleviate symptoms.  Ensure you are drinking plenty of water and getting plenty of rest.  Follow-up if any symptoms persist or worsen.

## 2023-02-15 DIAGNOSIS — I509 Heart failure, unspecified: Secondary | ICD-10-CM | POA: Insufficient documentation

## 2023-02-15 DIAGNOSIS — I2510 Atherosclerotic heart disease of native coronary artery without angina pectoris: Secondary | ICD-10-CM | POA: Insufficient documentation

## 2023-02-15 DIAGNOSIS — K7469 Other cirrhosis of liver: Secondary | ICD-10-CM | POA: Insufficient documentation

## 2023-02-15 DIAGNOSIS — I700 Atherosclerosis of aorta: Secondary | ICD-10-CM | POA: Insufficient documentation

## 2023-02-16 ENCOUNTER — Other Ambulatory Visit: Payer: Self-pay | Admitting: Nurse Practitioner

## 2023-02-16 DIAGNOSIS — K7469 Other cirrhosis of liver: Secondary | ICD-10-CM

## 2023-02-23 ENCOUNTER — Ambulatory Visit
Admission: EM | Admit: 2023-02-23 | Discharge: 2023-02-23 | Disposition: A | Discharge status: Home / Self Care | Payer: 59 | Attending: Physician Assistant | Admitting: Physician Assistant

## 2023-02-23 DIAGNOSIS — J069 Acute upper respiratory infection, unspecified: Secondary | ICD-10-CM | POA: Diagnosis not present

## 2023-02-23 DIAGNOSIS — Z1152 Encounter for screening for COVID-19: Secondary | ICD-10-CM | POA: Diagnosis not present

## 2023-02-23 NOTE — ED Triage Notes (Signed)
"  I have been coughing all night long with runny nose and now a headache". No fever known. "I have a lot of sinus pressure and pain". "No sleep last night". No exposure to COVID19 known.

## 2023-02-24 LAB — SARS CORONAVIRUS 2 (TAT 6-24 HRS): SARS Coronavirus 2: NEGATIVE

## 2023-02-26 ENCOUNTER — Ambulatory Visit
Admission: RE | Admit: 2023-02-26 | Discharge: 2023-02-26 | Disposition: A | Discharge status: Home / Self Care | Payer: 59 | Source: Ambulatory Visit | Attending: Nurse Practitioner | Admitting: Nurse Practitioner

## 2023-02-26 ENCOUNTER — Encounter: Payer: Self-pay | Admitting: Physician Assistant

## 2023-02-26 ENCOUNTER — Other Ambulatory Visit: Payer: 59

## 2023-02-26 DIAGNOSIS — K7469 Other cirrhosis of liver: Secondary | ICD-10-CM

## 2023-02-26 NOTE — ED Provider Notes (Signed)
[facility]    CSN: [id number] Arrival date & time: 02/23/23  0843      History   Chief Complaint Chief Complaint  Patient presents with   Cough    HPI [name] [name] is a 72 y.o. female.   Patient here today for evaluation of runny nose, headache and cough that started last night.  She has not any fever.  She does report significant amount of sinus pressure and pain.  She denies any exposure to COVID.  Over-the-counter treatment has not cleared symptoms.  The history is provided by the patient.  Cough Associated symptoms: headaches   Associated symptoms: no chills, no ear pain, no eye discharge, no fever, no shortness of breath, no sore throat and no wheezing     Past Medical History:  Diagnosis Date   Acute coronary syndrome (HCC)    Diabetes mellitus without complication (HCC)    Recurrent infiltrating ductal carcinoma of breast (HCC) 1999   1999 and 2006    Patient Active Problem List   Diagnosis Date Noted   Type 2 diabetes mellitus (HCC) 08/05/2019   Acute coronary syndrome (HCC) 06/18/2019   Recurrent infiltrating ductal carcinoma of breast (HCC) 06/18/2011    Past Surgical History:  Procedure Laterality Date   COLONOSCOPY  08/13/2010   Dr.Stark-normal   LEFT HEART CATH AND CORONARY ANGIOGRAPHY N/A 06/19/2019   Procedure: LEFT HEART CATH AND CORONARY ANGIOGRAPHY;  Surgeon: Orpah Cobb, MD;  Location: [facility];  Service: Cardiovascular;  Laterality: N/A;   MASTECTOMY Left 2006    OB History   No obstetric history on file.      Home Medications    Prior to Admission medications   Medication Sig Start Date End Date Taking? Authorizing Provider  aspirin EC 81 MG tablet Take 81 mg by mouth daily.   Yes [provider]  cholecalciferol (VITAMIN D) 1000 UNITS tablet Take 1,000 Units by mouth 2 (two) times daily.    Yes [provider]  Cyanocobalamin (VITAMIN B 12 PO) Take 1 tablet by mouth daily.   Yes  [provider]  fluticasone (FLONASE) 50 MCG/ACT nasal spray Place 1 spray into both nostrils daily. 02/02/23  Yes [place], Rolly Salter E, FNP  metFORMIN (GLUCOPHAGE) 1000 MG tablet Take 1,000 mg by mouth 2 (two) times daily with a meal.   Yes [provider]  atorvastatin (LIPITOR) 20 MG tablet 1 tablet 09/25/21   [provider]  benzonatate (TESSALON) 100 MG capsule Take 1 capsule (100 mg total) by mouth every 8 (eight) hours as needed for cough. 02/02/23   Gustavus Bryant, FNP  EPINEPHrine 0.15 MG/0.15ML IJ injection Inject 0.15 mLs (0.15 mg total) into the muscle as needed for anaphylaxis. Patient not taking: Reported on 10/27/2021 04/03/18   Gwyneth Sprout, MD  lisinopril (ZESTRIL) 10 MG tablet Take 1 tablet (10 mg total) by mouth daily. 01/09/23 02/08/23  Smitty Knudsen, PA-C  nitroGLYCERIN (NITROSTAT) 0.4 MG SL tablet Place 1 tablet (0.4 mg total) under the tongue every 5 (five) minutes x 3 doses as needed for chest pain. Patient not taking: Reported on 10/27/2021 06/20/19   Orpah Cobb, MD  TRULICITY 0.75 MG/0.5ML SOPN Inject into the skin. 08/20/21   [provider]    Family History Family History  Problem Relation Age of Onset   Breast cancer Sister    Colon cancer Neg Hx    Colon polyps Neg Hx    Esophageal cancer Neg Hx  Rectal cancer Neg Hx    Stomach cancer Neg Hx     Social History Social History   Tobacco Use   Smoking status: Never   Smokeless tobacco: Never   Tobacco comments:    never used product  Vaping Use   Vaping status: Never Used  Substance Use Topics   Alcohol use: No   Drug use: No     Allergies   Bee venom and Empagliflozin   Review of Systems Review of Systems  Constitutional:  Negative for chills and fever.  HENT:  Positive for congestion and sinus pressure. Negative for ear pain and sore throat.   Eyes:  Negative for discharge and redness.  Respiratory:  Positive for cough. Negative for shortness of breath and  wheezing.   Gastrointestinal:  Negative for abdominal pain, diarrhea, nausea and vomiting.  Neurological:  Positive for headaches.     Physical Exam Triage Vital Signs ED Triage Vitals  Encounter Vitals Group     BP 02/23/23 0900 118/73     Systolic BP Percentile --      Diastolic BP Percentile --      Pulse Rate 02/23/23 0900 79     Resp 02/23/23 0900 18     Temp 02/23/23 0900 98.8 F (37.1 C)     Temp Source 02/23/23 0900 Oral     SpO2 02/23/23 0900 98 %     Weight 02/23/23 0858 150 lb (68 kg)     Height 02/23/23 0858 5\' 4"  (1.626 m)     Head Circumference --      Peak Flow --      Pain Score 02/23/23 0857 0     Pain Loc --      Pain Education --      Exclude from Growth Chart --    No data found.  Updated Vital Signs BP 118/73 (BP Location: Left Arm)   Pulse 79   Temp 98.8 F (37.1 C) (Oral)   Resp 18   Ht 5\' 4"  (1.626 m)   Wt 150 lb (68 kg)   SpO2 98%   BMI 25.75 kg/m      Physical Exam Vitals and nursing note reviewed.  Constitutional:      General: She is not in acute distress.    Appearance: Normal appearance. She is not ill-appearing.  HENT:     Head: Normocephalic and atraumatic.     Nose: Congestion present.     Mouth/Throat:     Mouth: Mucous membranes are moist.     Pharynx: No oropharyngeal exudate or posterior oropharyngeal erythema.  Eyes:     Conjunctiva/sclera: Conjunctivae normal.  Cardiovascular:     Rate and Rhythm: Normal rate and regular rhythm.     Heart sounds: Normal heart sounds. No murmur heard. Pulmonary:     Effort: Pulmonary effort is normal. No respiratory distress.     Breath sounds: Normal breath sounds. No wheezing, rhonchi or rales.  Skin:    General: Skin is warm and dry.  Neurological:     Mental Status: She is alert.  Psychiatric:        Mood and Affect: Mood normal.        Thought Content: Thought content normal.      UC Treatments / Results  Labs (all labs ordered are listed, but only abnormal results  are displayed) Labs Reviewed  SARS CORONAVIRUS 2 (TAT 6-24 HRS)    EKG   Radiology No results found.  Procedures Procedures (including  critical care time)  Medications Ordered in UC Medications - No data to display  Initial Impression / Assessment and Plan / UC Course  I have reviewed the triage vital signs and the nursing notes.  Pertinent labs & imaging results that were available during my care of the patient were reviewed by me and considered in my medical decision making (see chart for details).    Suspect viral etiology of symptoms.  COVID screening ordered.  Recommend symptomatic treatment, increase fluids and rest with follow-up if no gradual improvement with any further concerns.  Final Clinical Impressions(s) / UC Diagnoses   Final diagnoses:  Acute upper respiratory infection   Discharge Instructions   None    ED Prescriptions   None    PDMP not reviewed this encounter.   Tomi Bamberger, PA-C 02/26/23 [phone]

## 2023-03-09 ENCOUNTER — Other Ambulatory Visit: Payer: Self-pay | Admitting: Nurse Practitioner

## 2023-03-09 DIAGNOSIS — Z1231 Encounter for screening mammogram for malignant neoplasm of breast: Secondary | ICD-10-CM

## 2023-03-10 ENCOUNTER — Other Ambulatory Visit: Payer: Self-pay | Admitting: Nurse Practitioner

## 2023-03-10 DIAGNOSIS — E2839 Other primary ovarian failure: Secondary | ICD-10-CM

## 2023-04-29 ENCOUNTER — Ambulatory Visit: Payer: 59

## 2023-05-26 ENCOUNTER — Ambulatory Visit: Payer: 59

## 2023-07-09 ENCOUNTER — Ambulatory Visit
Admission: RE | Admit: 2023-07-09 | Discharge: 2023-07-09 | Disposition: A | Discharge status: Home / Self Care | Payer: 59 | Source: Ambulatory Visit | Attending: Nurse Practitioner | Admitting: Nurse Practitioner

## 2023-07-09 DIAGNOSIS — Z1231 Encounter for screening mammogram for malignant neoplasm of breast: Secondary | ICD-10-CM

## 2023-08-23 ENCOUNTER — Other Ambulatory Visit: Payer: Self-pay | Admitting: Nurse Practitioner

## 2023-08-23 DIAGNOSIS — K7581 Nonalcoholic steatohepatitis (NASH): Secondary | ICD-10-CM

## 2023-08-23 DIAGNOSIS — K7469 Other cirrhosis of liver: Secondary | ICD-10-CM

## 2023-09-02 ENCOUNTER — Other Ambulatory Visit: Payer: 59

## 2023-09-08 ENCOUNTER — Ambulatory Visit
Admission: RE | Admit: 2023-09-08 | Discharge: 2023-09-08 | Disposition: A | Discharge status: Home / Self Care | Source: Ambulatory Visit | Attending: Nurse Practitioner

## 2023-09-08 DIAGNOSIS — K7581 Nonalcoholic steatohepatitis (NASH): Secondary | ICD-10-CM

## 2023-09-08 DIAGNOSIS — K7469 Other cirrhosis of liver: Secondary | ICD-10-CM

## 2023-10-11 ENCOUNTER — Encounter: Payer: Self-pay | Admitting: Emergency Medicine

## 2023-10-11 ENCOUNTER — Ambulatory Visit: Admission: EM | Admit: 2023-10-11 | Discharge: 2023-10-11 | Disposition: A | Discharge status: Home / Self Care

## 2023-10-11 DIAGNOSIS — J069 Acute upper respiratory infection, unspecified: Secondary | ICD-10-CM

## 2023-10-11 MED ORDER — AMOXICILLIN-POT CLAVULANATE 875-125 MG PO TABS: 1.0000 | ORAL_TABLET | Freq: Two times a day (BID) | ORAL | 0 refills | Status: AC

## 2023-10-11 MED ORDER — PROMETHAZINE-DM 6.25-15 MG/5ML PO SYRP: 5.0000 mL | ORAL_SOLUTION | Freq: Every evening | ORAL | 0 refills | Status: AC | PRN

## 2023-10-11 MED ORDER — BENZONATATE 100 MG PO CAPS: 100.0000 mg | ORAL_CAPSULE | Freq: Three times a day (TID) | ORAL | 0 refills | Status: AC

## 2023-10-11 MED ORDER — PREDNISONE 10 MG (21) PO TBPK: ORAL_TABLET | Freq: Every day | ORAL | 0 refills | Status: AC

## 2023-10-11 MED ORDER — DEXAMETHASONE SODIUM PHOSPHATE 10 MG/ML IJ SOLN
10.0000 mg | Freq: Once | INTRAMUSCULAR | Status: AC
  Administered 2023-10-11: 10 mg via INTRAMUSCULAR

## 2023-10-11 NOTE — ED Provider Notes (Signed)
 [facility]    CSN: [id number] Arrival date & time: 10/11/23  0840      History   Chief Complaint Chief Complaint  Patient presents with   Cough   chest congestion    Nasal Congestion   Headache    HPI [name] [name] is a 73 y.o. female.   Patient presents for evaluation of subjective fever, nasal congestion, rhinorrhea, sore throat, productive cough, diarrhea and nausea without vomiting present for 4 days.  Overnight began to experience centralized chest discomfort and centralized back pain exacerbated by coughing, symptoms interfering with sleep.  Did discuss symptoms with PCP who recommended over-the-counter Robitussin, Alka-Seltzer and increase fluids which patient has done with no improvement of symptoms.  Tolerating food and liquids but appetite is decreased.  No sick contacts prior.  Denies respiratory history, non-smoker.    Past Medical History:  Diagnosis Date   Acute coronary syndrome (HCC)    Diabetes mellitus without complication (HCC)    Recurrent infiltrating ductal carcinoma of breast (HCC) 1999   1999 and 2006    Patient Active Problem List   Diagnosis Date Noted   Metabolic dysfunction-associated steatohepatitis (MASH) 08/23/2023   Aortic atherosclerosis (HCC) 02/15/2023   Congestive heart failure (CHF) (HCC) 02/15/2023   Coronary artery disease 02/15/2023   Other cirrhosis of liver (HCC) 02/15/2023   Type 2 diabetes mellitus (HCC) 08/05/2019   History of malignant neoplasm of breast 08/05/2019   Hyperlipidemia 08/05/2019   Acute coronary syndrome (HCC) 06/18/2019   Recurrent infiltrating ductal carcinoma of breast (HCC) 06/18/2011    Past Surgical History:  Procedure Laterality Date   COLONOSCOPY  08/13/2010   Dr.Stark-normal   LEFT HEART CATH AND CORONARY ANGIOGRAPHY N/A 06/19/2019   Procedure: LEFT HEART CATH AND CORONARY ANGIOGRAPHY;  Surgeon: Pasqual Bone, MD;  Location: [facility];  Service: Cardiovascular;   Laterality: N/A;   MASTECTOMY Left 2006    OB History   No obstetric history on file.      Home Medications    Prior to Admission medications   Medication Sig Start Date End Date Taking? Authorizing Provider  amoxicillin (AMOXIL) 500 MG capsule Take by mouth. 04/15/23  Yes [provider]  losartan (COZAAR) 25 MG tablet TAKE 1 TAB BY MOUTH ONCE DAILY FOR GOUT PREVENTION 03/19/22  Yes [provider]  ascorbic acid (VITAMIN C) 500 MG tablet Take by mouth.    [provider]  aspirin EC 81 MG tablet Take 81 mg by mouth daily.    [provider]  atorvastatin (LIPITOR) 20 MG tablet 1 tablet 09/25/21   [provider]  benzonatate (TESSALON) 100 MG capsule Take 1 capsule (100 mg total) by mouth every 8 (eight) hours as needed for cough. 02/02/23   Dodson Freestone, FNP  cholecalciferol (VITAMIN D) 1000 UNITS tablet Take 1,000 Units by mouth 2 (two) times daily.     [provider]  Cyanocobalamin (VITAMIN B 12 PO) Take 1 tablet by mouth daily.    [provider]  EPINEPHrine 0.15 MG/0.15ML IJ injection Inject 0.15 mLs (0.15 mg total) into the muscle as needed for anaphylaxis. Patient not taking: Reported on 10/27/2021 04/03/18   Almond Army, MD  fluticasone (FLONASE) 50 MCG/ACT nasal spray Place 1 spray into both nostrils daily. 02/02/23   Dodson Freestone, FNP  glucose blood test strip     [provider]  ibuprofen (ADVIL) 800 MG tablet TAKE 1 TABLET BY MOUTH WITH FOOD AS NEEDED  FOR PAIN    [provider]  lisinopril (ZESTRIL) 10 MG tablet Take 1 tablet (10 mg total) by mouth daily. 01/09/23 02/08/23  Zelaya, Oscar A, PA-C  metFORMIN (GLUCOPHAGE) 1000 MG tablet Take 1,000 mg by mouth 2 (two) times daily with a meal.    [provider]  nitroGLYCERIN (NITROSTAT) 0.4 MG SL tablet Place 1 tablet (0.4 mg total) under the tongue every 5 (five) minutes x 3 doses as needed for chest pain. Patient not taking:  Reported on 10/27/2021 06/20/19   Pasqual Bone, MD  TRULICITY 0.75 MG/0.5ML SOPN Inject into the skin. 08/20/21   [provider]    Family History Family History  Problem Relation Age of Onset   Breast cancer Sister    Colon cancer Neg Hx    Colon polyps Neg Hx    Esophageal cancer Neg Hx    Rectal cancer Neg Hx    Stomach cancer Neg Hx     Social History Social History   Tobacco Use   Smoking status: Never   Smokeless tobacco: Never   Tobacco comments:    never used product  Vaping Use   Vaping status: Never Used  Substance Use Topics   Alcohol use: No   Drug use: No     Allergies   Bee venom and Empagliflozin   Review of Systems Review of Systems  Constitutional:  Positive for fever. Negative for activity change, appetite change, chills, diaphoresis, fatigue and unexpected weight change.  HENT:  Positive for congestion, rhinorrhea and sore throat. Negative for dental problem, drooling, ear discharge, ear pain, facial swelling, hearing loss, mouth sores, nosebleeds, postnasal drip, sinus pressure, sinus pain, sneezing, tinnitus, trouble swallowing and voice change.   Respiratory:  Positive for cough. Negative for apnea, choking, chest tightness, shortness of breath, wheezing and stridor.   Gastrointestinal:  Positive for diarrhea and nausea. Negative for abdominal distention, abdominal pain, anal bleeding, blood in stool, constipation, rectal pain and vomiting.  Neurological: Negative.      Physical Exam Triage Vital Signs ED Triage Vitals  Encounter Vitals Group     BP 10/11/23 0857 121/77     Systolic BP Percentile --      Diastolic BP Percentile --      Pulse Rate 10/11/23 0857 84     Resp 10/11/23 0857 20     Temp 10/11/23 0857 98.4 F (36.9 C)     Temp Source 10/11/23 0857 Oral     SpO2 10/11/23 0857 96 %     Weight --      Height --      Head Circumference --      Peak Flow --      Pain Score 10/11/23 0855 7     Pain Loc --      Pain  Education --      Exclude from Growth Chart --    No data found.  Updated Vital Signs BP 121/77 (BP Location: Right Arm)   Pulse 84   Temp 98.4 F (36.9 C) (Oral)   Resp 20   SpO2 96%   Visual Acuity Right Eye Distance:   Left Eye Distance:   Bilateral Distance:    Right Eye Near:   Left Eye Near:    Bilateral Near:     Physical Exam Constitutional:      Appearance: Normal appearance.  HENT:     Right Ear: Tympanic membrane, ear canal and external ear normal.     Left  Ear: Tympanic membrane, ear canal and external ear normal.     Nose: Congestion present.     Mouth/Throat:     Pharynx: No oropharyngeal exudate or posterior oropharyngeal erythema.  Eyes:     Extraocular Movements: Extraocular movements intact.  Cardiovascular:     Rate and Rhythm: Normal rate and regular rhythm.     Pulses: Normal pulses.     Heart sounds: Normal heart sounds.  Pulmonary:     Effort: Pulmonary effort is normal.     Breath sounds: Wheezing present.  Musculoskeletal:     Cervical back: Normal range of motion and neck supple.  Neurological:     Mental Status: She is alert and oriented to person, place, and time. Mental status is at baseline.      UC Treatments / Results  Labs (all labs ordered are listed, but only abnormal results are displayed) Labs Reviewed - No data to display  EKG   Radiology No results found.  Procedures Procedures (including critical care time)  Medications Ordered in UC Medications - No data to display  Initial Impression / Assessment and Plan / UC Course  I have reviewed the triage vital signs and the nursing notes.  Pertinent labs & imaging results that were available during my care of the patient were reviewed by me and considered in my medical decision making (see chart for details).  Viral URI with cough  Patient is in no signs of distress nor toxic appearing.  Vital signs are stable.  Low suspicion for pneumonia, pneumothorax or  bronchitis and therefore will defer imaging.  Etiology most likely viral, symptoms present for 4 days, prescribed prednisone, Tessalon and promethazine DM, Decadron IM given prior to discharge to help with wheezing heard on exam as well as chest discomfort.  Watch wait antibiotic placed to pharmacy on day 10 if no improvement seen.May use additional over-the-counter medications as needed for supportive care.  May follow-up with urgent care as needed if symptoms persist or worsen.    Final Clinical Impressions(s) / UC Diagnoses   Final diagnoses:  None   Discharge Instructions   None    ED Prescriptions   None    PDMP not reviewed this encounter.   Reena Canning, [place] 10/11/23 [phone]

## 2023-10-11 NOTE — ED Triage Notes (Signed)
 Pt presents with cold sxs x 4 days. Pt states she went swimming on Thursday and have been feeling the sxs ever since. Pt was advised by PCP to take OTC meds for sxs but they sxs are worsening.

## 2023-10-11 NOTE — Discharge Instructions (Signed)
 Your symptoms today are most likely being caused by a virus and should steadily improve in time it can take up to 7 to 10 days before you truly start to see a turnaround however things will get better, if no improvement seen by Sunday you may pick up Augmentin from the pharmacy  You have been given an injection of steroids to help and relax the airway and also help with muscular pain, should see improvement within the hour  Starting tomorrow begin prednisone every morning as directed, take with food, this opens and relaxes the airway as well as helps with muscular pain  You may use Tessalon pill every 8 hours as needed to help calm your cough, use cough syrup at bedtime for additional comfort    You can take Tylenol as needed for fever reduction and pain relief.   For cough: honey 1/2 to 1 teaspoon (you can dilute the honey in water or another fluid).  You can also use guaifenesin and dextromethorphan for cough. You can use a humidifier for chest congestion and cough.  If you don't have a humidifier, you can sit in the bathroom with the hot shower running.      For sore throat: try warm salt water gargles, cepacol lozenges, throat spray, warm tea or water with lemon/honey, popsicles or ice, or OTC cold relief medicine for throat discomfort.   For congestion: take a daily anti-histamine like Zyrtec, Claritin, and a oral decongestant, such as pseudoephedrine.  You can also use Flonase 1-2 sprays in each nostril daily.   It is important to stay hydrated: drink plenty of fluids (water, gatorade/powerade/pedialyte, juices, or teas) to keep your throat moisturized and help further relieve irritation/discomfort.

## 2023-10-13 ENCOUNTER — Other Ambulatory Visit: Payer: Self-pay

## 2023-10-13 ENCOUNTER — Encounter (HOSPITAL_COMMUNITY): Payer: Self-pay | Admitting: Radiology

## 2023-10-13 ENCOUNTER — Emergency Department (HOSPITAL_COMMUNITY)

## 2023-10-13 ENCOUNTER — Emergency Department (HOSPITAL_COMMUNITY): Admission: EM | Admit: 2023-10-13 | Discharge: 2023-10-13 | Disposition: A | Discharge status: Home / Self Care

## 2023-10-13 DIAGNOSIS — I2510 Atherosclerotic heart disease of native coronary artery without angina pectoris: Secondary | ICD-10-CM | POA: Diagnosis not present

## 2023-10-13 DIAGNOSIS — R059 Cough, unspecified: Secondary | ICD-10-CM | POA: Insufficient documentation

## 2023-10-13 DIAGNOSIS — Z7982 Long term (current) use of aspirin: Secondary | ICD-10-CM | POA: Insufficient documentation

## 2023-10-13 DIAGNOSIS — R55 Syncope and collapse: Secondary | ICD-10-CM | POA: Insufficient documentation

## 2023-10-13 DIAGNOSIS — Z7984 Long term (current) use of oral hypoglycemic drugs: Secondary | ICD-10-CM | POA: Insufficient documentation

## 2023-10-13 DIAGNOSIS — R197 Diarrhea, unspecified: Secondary | ICD-10-CM | POA: Diagnosis not present

## 2023-10-13 DIAGNOSIS — Z853 Personal history of malignant neoplasm of breast: Secondary | ICD-10-CM | POA: Diagnosis not present

## 2023-10-13 DIAGNOSIS — E119 Type 2 diabetes mellitus without complications: Secondary | ICD-10-CM | POA: Diagnosis not present

## 2023-10-13 DIAGNOSIS — Z79899 Other long term (current) drug therapy: Secondary | ICD-10-CM | POA: Diagnosis not present

## 2023-10-13 DIAGNOSIS — I509 Heart failure, unspecified: Secondary | ICD-10-CM | POA: Insufficient documentation

## 2023-10-13 LAB — CBC
HCT: 41.9 % (ref 36.0–46.0)
Hemoglobin: 13.5 g/dL (ref 12.0–15.0)
MCH: 30.7 pg (ref 26.0–34.0)
MCHC: 32.2 g/dL (ref 30.0–36.0)
MCV: 95.2 fL (ref 80.0–100.0)
Platelets: 212 10*3/uL (ref 150–400)
RBC: 4.4 MIL/uL (ref 3.87–5.11)
RDW: 13.1 % (ref 11.5–15.5)
WBC: 7.8 10*3/uL (ref 4.0–10.5)
nRBC: 0 % (ref 0.0–0.2)

## 2023-10-13 LAB — TROPONIN I (HIGH SENSITIVITY): Troponin I (High Sensitivity): 4 ng/L (ref <18)

## 2023-10-13 LAB — COMPREHENSIVE METABOLIC PANEL WITH GFR
ALT: 25 U/L (ref 0–44)
AST: 25 U/L (ref 15–41)
Albumin: 3.8 g/dL (ref 3.5–5.0)
Alkaline Phosphatase: 104 U/L (ref 38–126)
Anion gap: 9 (ref 5–15)
BUN: 13 mg/dL (ref 8–23)
CO2: 29 mmol/L (ref 22–32)
Calcium: 9.7 mg/dL (ref 8.9–10.3)
Chloride: 98 mmol/L (ref 98–111)
Creatinine, Ser: 1 mg/dL (ref 0.44–1.00)
GFR, Estimated: 59 mL/min — ABNORMAL LOW (ref >60)
Glucose, Bld: 244 mg/dL — ABNORMAL HIGH (ref 70–99)
Potassium: 4.1 mmol/L (ref 3.5–5.1)
Sodium: 136 mmol/L (ref 135–145)
Total Bilirubin: 0.7 mg/dL (ref 0.0–1.2)
Total Protein: 7.6 g/dL (ref 6.5–8.1)

## 2023-10-13 LAB — CBG MONITORING, ED: Glucose-Capillary: 243 mg/dL — ABNORMAL HIGH (ref 70–99)

## 2023-10-13 LAB — D-DIMER, QUANTITATIVE: D-Dimer, Quant: 1.22 ug{FEU}/mL — ABNORMAL HIGH (ref 0.00–0.50)

## 2023-10-13 LAB — URINALYSIS, ROUTINE W REFLEX MICROSCOPIC
Bacteria, UA: NONE SEEN
Bilirubin Urine: NEGATIVE
Glucose, UA: 50 mg/dL — AB
Hgb urine dipstick: NEGATIVE
Ketones, ur: 5 mg/dL — AB
Nitrite: NEGATIVE
Protein, ur: 100 mg/dL — AB
Specific Gravity, Urine: 1.028 (ref 1.005–1.030)
pH: 5 (ref 5.0–8.0)

## 2023-10-13 LAB — LIPASE, BLOOD: Lipase: 25 U/L (ref 11–51)

## 2023-10-13 MED ORDER — IOHEXOL 350 MG/ML SOLN
75.0000 mL | Freq: Once | INTRAVENOUS | Status: AC | PRN
  Administered 2023-10-13: 75 mL via INTRAVENOUS

## 2023-10-13 MED ORDER — SODIUM CHLORIDE 0.9 % IV BOLUS
500.0000 mL | Freq: Once | INTRAVENOUS | Status: AC
  Administered 2023-10-13: 500 mL via INTRAVENOUS

## 2023-10-13 MED ORDER — ONDANSETRON HCL 4 MG/2ML IJ SOLN
4.0000 mg | Freq: Once | INTRAMUSCULAR | Status: AC
  Administered 2023-10-13: 4 mg via INTRAVENOUS
  Filled 2023-10-13: qty 2

## 2023-10-13 MED ORDER — SODIUM CHLORIDE (PF) 0.9 % IJ SOLN
INTRAMUSCULAR | Status: AC
  Filled 2023-10-13: qty 50

## 2023-10-13 NOTE — Discharge Instructions (Signed)
 You were evaluated in the emergency room following a syncopal event.  Your lab work did not show any significant abnormality.  Please follow-up with your primary care doctor to ensure your symptoms are improving.

## 2023-10-13 NOTE — ED Triage Notes (Signed)
 Pt BIBA. C/o syncopal event while at [facility]. Regained consciousness quickly. C/o productive cough, nausea and diarrhea for several days.  Aox4  Given 300 NS by EMS, 18 in LAC

## 2023-10-13 NOTE — ED Provider Notes (Signed)
 [place] EMERGENCY DEPARTMENT AT [facility] Provider Note   CSN: [id number] Arrival date & time: 10/13/23  1304     History  No chief complaint on file.   [name] [name] is a 73 y.o. female. With past medical history of DM, ACS, history of breast cancer in remission, CHF w/ normal EF, cirrhosis presenting with complaint of syncope. Patient was standing for prolonged period of time in [place] when she got lightheaded and tried to walk to sit down. She passed out, dose not think she hit her head but is now having right sided HA. She was able to slowly lower herself to the ground. LOC as seconds. Not on blood thinner. Patient reports for the past 5 days she has generally been feeling unwell with muscle aches, diarrhea up to 5 episodes a day which has now improved. Denies hematochezia, melena or pus.  Denies recent antibiotic use or out of the country travel.  She has also had associated cough which has become productive over the last 2 to 3 days.  She does report some shortness of breath when exerting herself.  She denies swelling in her feet and ankles. Denies chest pain. She is not on blood thinner.   HPI     Home Medications Prior to Admission medications   Medication Sig Start Date End Date Taking? Authorizing Provider  amoxicillin (AMOXIL) 500 MG capsule Take by mouth. 04/15/23   [provider]  amoxicillin-clavulanate (AUGMENTIN) 875-125 MG tablet Take 1 tablet by mouth every 12 (twelve) hours. 10/16/23   White, Maybelle Spatz, NP  ascorbic acid (VITAMIN C) 500 MG tablet Take by mouth.    [provider]  aspirin EC 81 MG tablet Take 81 mg by mouth daily.    [provider]  atorvastatin (LIPITOR) 20 MG tablet 1 tablet 09/25/21   [provider]  benzonatate (TESSALON) 100 MG capsule Take 1 capsule (100 mg total) by mouth every 8 (eight) hours. 10/11/23   Reena Canning, NP  cholecalciferol (VITAMIN D) 1000 UNITS tablet Take 1,000  Units by mouth 2 (two) times daily.     [provider]  Cyanocobalamin (VITAMIN B 12 PO) Take 1 tablet by mouth daily.    [provider]  EPINEPHrine 0.15 MG/0.15ML IJ injection Inject 0.15 mLs (0.15 mg total) into the muscle as needed for anaphylaxis. Patient not taking: Reported on 10/27/2021 04/03/18   Almond Army, MD  fluticasone (FLONASE) 50 MCG/ACT nasal spray Place 1 spray into both nostrils daily. 02/02/23   Dodson Freestone, FNP  glucose blood test strip     [provider]  ibuprofen (ADVIL) 800 MG tablet TAKE 1 TABLET BY MOUTH WITH FOOD AS NEEDED FOR PAIN    [provider]  lisinopril (ZESTRIL) 10 MG tablet Take 1 tablet (10 mg total) by mouth daily. 01/09/23 02/08/23  Zelaya, Oscar A, PA-C  losartan (COZAAR) 25 MG tablet TAKE 1 TAB BY MOUTH ONCE DAILY FOR GOUT PREVENTION 03/19/22   [provider]  metFORMIN (GLUCOPHAGE) 1000 MG tablet Take 1,000 mg by mouth 2 (two) times daily with a meal.    [provider]  nitroGLYCERIN (NITROSTAT) 0.4 MG SL tablet Place 1 tablet (0.4 mg total) under the tongue every 5 (five) minutes x 3 doses as needed for chest pain. Patient not taking: Reported on 10/27/2021 06/20/19   Pasqual Bone, MD  predniSONE (STERAPRED UNI-PAK 21 TAB) 10 MG (21) TBPK tablet Take by mouth daily. Take 6 tabs  by mouth daily  for 1 days, then 5 tabs for 1 days, then 4 tabs for 1 days, then 3 tabs for 1 days, 2 tabs for 1 days, then 1 tab by mouth daily for 1 days 10/11/23   Reena Canning, NP  promethazine-dextromethorphan (PROMETHAZINE-DM) 6.25-15 MG/5ML syrup Take 5 mLs by mouth at bedtime as needed for cough. 10/11/23   White, Adrienne R, NP  TRULICITY 0.75 MG/0.5ML SOPN Inject into the skin. 08/20/21   [provider]      Allergies    Bee venom and Empagliflozin    Review of Systems   Review of Systems  Neurological:  Positive for syncope.    Physical Exam Updated Vital Signs There were no vitals taken  for this visit. Physical Exam Vitals and nursing note reviewed.  Constitutional:      General: She is not in acute distress.    Appearance: She is not toxic-appearing.  HENT:     Head: Normocephalic and atraumatic.  Eyes:     General: No scleral icterus.    Conjunctiva/sclera: Conjunctivae normal.  Cardiovascular:     Rate and Rhythm: Normal rate and regular rhythm.     Pulses: Normal pulses.     Heart sounds: Normal heart sounds.  Pulmonary:     Effort: Pulmonary effort is normal. No respiratory distress.     Breath sounds: Normal breath sounds.  Abdominal:     General: Abdomen is flat. Bowel sounds are normal.     Palpations: Abdomen is soft.     Tenderness: There is no abdominal tenderness.  Musculoskeletal:     Right lower leg: No edema.     Left lower leg: No edema.  Skin:    General: Skin is warm and dry.     Findings: No lesion.  Neurological:     General: No focal deficit present.     Mental Status: She is alert and oriented to person, place, and time. Mental status is at baseline.     ED Results / Procedures / Treatments   Labs (all labs ordered are listed, but only abnormal results are displayed) Labs Reviewed  COMPREHENSIVE METABOLIC PANEL WITH GFR - Abnormal; Notable for the following components:      Result Value   Glucose, Bld 244 (*)    GFR, Estimated 59 (*)    All other components within normal limits  D-DIMER, QUANTITATIVE - Abnormal; Notable for the following components:   D-Dimer, Quant 1.22 (*)    All other components within normal limits  CBG MONITORING, ED - Abnormal; Notable for the following components:   Glucose-Capillary 243 (*)    All other components within normal limits  CBC  LIPASE, BLOOD  URINALYSIS, ROUTINE W REFLEX MICROSCOPIC    EKG None  Radiology CT Head Wo Contrast Result Date: 10/13/2023 CLINICAL DATA:  Provided history: Head trauma, minor. EXAM: CT HEAD WITHOUT CONTRAST TECHNIQUE: Contiguous axial images were obtained  from the base of the skull through the vertex without intravenous contrast. RADIATION DOSE REDUCTION: This exam was performed according to the departmental dose-optimization program which includes automated exposure control, adjustment of the mA and/or kV according to patient size and/or use of iterative reconstruction technique. COMPARISON:  Brain MRI 01/09/2023. Non-contrast head CT and CT angiogram head 01/09/2023. FINDINGS: Brain: No age-advanced or lobar predominant cerebral atrophy. Small hyperdense focus within the right basal ganglia/internal capsule (at site of a known chronic hemorrhage) (series 2, image 17). This hyperdensity may reflect chronic blood products  and/or mineralization. Partially empty sella turcica. There is no acute intracranial hemorrhage. No demarcated cortical infarct. No extra-axial fluid collection. No evidence of an intracranial mass. No midline shift. Vascular: No hyperdense vessel. Skull: No calvarial fracture or aggressive osseous lesion. Sinuses/Orbits: No mass or acute finding within the imaged orbits. Mild mucosal thickening within the bilateral ethmoid, sphenoid and maxillary sinuses at the imaged levels. IMPRESSION: 1. No evidence of an acute intracranial abnormality. 2. Known chronic hemorrhage within the right basal ganglia/internal capsule. 3. Mild paranasal sinus mucosal thickening at the imaged levels. Electronically Signed   By: Bascom Lily D.O.   On: 10/13/2023 14:56    Procedures Procedures    Medications Ordered in ED Medications - No data to display  ED Course/ Medical Decision Making/ A&P                                 Medical Decision Making Amount and/or Complexity of Data Reviewed Labs: ordered. Radiology: ordered.  Risk Prescription drug management.   This patient presents to the ED for concern of syncope, this involves an extensive number of treatment options, and is a complaint that carries with it a high risk of complications and  morbidity.  The differential diagnosis includes vasovagal, dehydration, ACS, PE, electrolyte abn, URI, CHF, PNA   Co morbidities that complicate the patient evaluation  DM2, CAD, CHF, cirrhosis    Additional history obtained:  Additional history obtained from 02/23/24, 10/11/18 Ed visit    Lab Tests:  I personally interpreted labs.  The pertinent results include:   Dimer elevated thus ordered CTA.  CBC without leukocytosis and no anemia  CMP wnl, elevated glucose Lipase 25 UA pending    Imaging Studies ordered:  I ordered imaging studies including head CT, chest x-ray  I independently visualized and interpreted imaging which showed no acute head Head CT findings. Chest x-ray and PE study PENDING  I agree with the radiologist interpretation   Cardiac Monitoring: / EKG:  The patient was maintained on a cardiac monitor.  I personally viewed and interpreted the cardiac monitored which showed an underlying rhythm of: sinus     Problem List / ED Course / Critical interventions / Medication management  Reporting to emergency room with complaint of syncope.  Does not feel like she had preceding chest pain or palpitations.  She did get the feeling that she was lightheaded but was unable to sit before passing out.  He does not think that she hit her head however is having right sided HA.  She reports she was out for some seconds.  She is alert oriented hemodynamically stable.  Will obtain head CT to rule out intracranial abnormality.  Given recent URI-like symptoms will obtain chest x-ray to rule out pneumonia.  Ordering D-dimer with shortness of breath and cough to rule out PE.  Will obtain EKG to rule out electrolyte abnormality.  Will obtain basic labs.  Will give nausea medicine and fluids and reassess   I ordered medication including zofran, NS Reevaluation of the patient after these medicines showed that the patient improved I have reviewed the patients home medicines and have  made adjustments as needed   Plan  Signed out to oncoming Ed PA. Pending chest x-ray and CTA given positive dimer.        Final Clinical Impression(s) / ED Diagnoses Final diagnoses:  Syncope and collapse    Rx / DC Orders ED  Discharge Orders     None         Eudora Heron, PA-C 10/13/23 1516    Rolinda Climes, DO 10/13/23 1546

## 2023-10-13 NOTE — ED Provider Notes (Signed)
 Signout from [employer], PA-C at shift change.  Patient has a history of ACS, type 2 diabetes, CHF, CAD, breast cancer and mentioning presents following syncopal episode that happened earlier today.  No preceding chest pain, shortness of breath.  No history of seizures.  No urinary incontinence.  She is unsure whether she hit her head.  LOC reportedly last seconds.  She is not on blood thinners.  She does report that she has been feeling generally unwell over the past few days with some myalgias, diarrhea and productive cough.  Was seen at urgent care this past Monday.  There was concern for pneumonia.  However she has not picked up the antibiotics.   CBC unremarkable, lipase within normal limits, CMP without significant abnormality, D-dimer elevated at 1.22.  CT head without acute findings.  CT angio chest pending, UA with moderate leukocytes, negative nitrites, 11-20 WBC, trop without elevation  CT angio without acute findings, chest x-ray without acute findings  Orthostatic Lying BP- Lying: 118/66 Pulse- Lying: 71 Orthostatic Sitting BP- Sitting: 118/62 Pulse- Sitting: 76 Orthostatic Standing at 0 minutes BP- Standing at 0 minutes: 105/59 Pulse- Standing at 0 minutes: 82 Orthostatic Standing at 3 minutes BP- Standing at 3 minutes: 114/62 Pulse- Standing at 3 minutes: 79     3:04 PM Reassessment performed. Patient appears comfortable, alert and oriented, no neuro deficits, coarse left lung sounds, abd nontender, normocephalic atraumatic, will send in culture for urine.  Patient is asymptomatic, squamous cells present, would not treat at this time.  Patient be discharged home with overall unremarkable workup.  Syncopal event likely secondary to recent viral illness.  Will follow-up with primary care doctor to ensure symptoms are improving.    Most current vital signs reviewed and are as follows: BP 127/63   Pulse 68   Temp 97.7 F (36.5 C) (Oral)   Resp 18   Ht 5\' 4"  (1.626 m)   Wt 68  kg   SpO2 100%   BMI 25.73 kg/m     The patient has been appropriately medically screened and/or stabilized in the ED. I have low suspicion for any other emergent medical condition which would require further screening, evaluation or treatment in the ED or require inpatient management. At time of discharge the patient is hemodynamically stable and in no acute distress. I have discussed work-up results and diagnosis with patient and answered all questions. Patient is agreeable with discharge plan. We discussed strict return precautions for returning to the emergency department and they verbalized understanding.      Felicie Horning, PA-C 10/13/23 1915    Sallyanne Creamer, DO 10/17/23 1336

## 2023-10-15 LAB — URINE CULTURE: Culture: <10000 — AB

## 2023-12-07 ENCOUNTER — Other Ambulatory Visit: Payer: 59

## 2024-01-10 ENCOUNTER — Other Ambulatory Visit (HOSPITAL_BASED_OUTPATIENT_CLINIC_OR_DEPARTMENT_OTHER)

## 2024-01-10 ENCOUNTER — Encounter (HOSPITAL_BASED_OUTPATIENT_CLINIC_OR_DEPARTMENT_OTHER): Payer: Self-pay

## 2024-01-30 ENCOUNTER — Other Ambulatory Visit: Payer: Self-pay

## 2024-01-30 ENCOUNTER — Emergency Department (HOSPITAL_COMMUNITY)

## 2024-01-30 ENCOUNTER — Encounter (HOSPITAL_COMMUNITY): Payer: Self-pay

## 2024-01-30 ENCOUNTER — Emergency Department (HOSPITAL_COMMUNITY)
Admission: EM | Admit: 2024-01-30 | Discharge: 2024-01-30 | Disposition: A | Discharge status: Home / Self Care | Attending: Emergency Medicine | Admitting: Emergency Medicine

## 2024-01-30 DIAGNOSIS — R42 Dizziness and giddiness: Secondary | ICD-10-CM | POA: Diagnosis present

## 2024-01-30 DIAGNOSIS — I509 Heart failure, unspecified: Secondary | ICD-10-CM | POA: Insufficient documentation

## 2024-01-30 DIAGNOSIS — Z853 Personal history of malignant neoplasm of breast: Secondary | ICD-10-CM | POA: Diagnosis not present

## 2024-01-30 DIAGNOSIS — Z7982 Long term (current) use of aspirin: Secondary | ICD-10-CM | POA: Insufficient documentation

## 2024-01-30 DIAGNOSIS — R748 Abnormal levels of other serum enzymes: Secondary | ICD-10-CM | POA: Insufficient documentation

## 2024-01-30 LAB — CBC WITH DIFFERENTIAL/PLATELET
Abs Immature Granulocytes: 0.02 K/uL (ref 0.00–0.07)
Basophils Absolute: 0 K/uL (ref 0.0–0.1)
Basophils Relative: 1 %
Eosinophils Absolute: 0.1 K/uL (ref 0.0–0.5)
Eosinophils Relative: 3 %
HCT: 39.5 % (ref 36.0–46.0)
Hemoglobin: 12.6 g/dL (ref 12.0–15.0)
Immature Granulocytes: 0 %
Lymphocytes Relative: 31 %
Lymphs Abs: 1.5 K/uL (ref 0.7–4.0)
MCH: 29.8 pg (ref 26.0–34.0)
MCHC: 31.9 g/dL (ref 30.0–36.0)
MCV: 93.4 fL (ref 80.0–100.0)
Monocytes Absolute: 0.4 K/uL (ref 0.1–1.0)
Monocytes Relative: 8 %
Neutro Abs: 2.9 K/uL (ref 1.7–7.7)
Neutrophils Relative %: 57 %
Platelets: 282 K/uL (ref 150–400)
RBC: 4.23 MIL/uL (ref 3.87–5.11)
RDW: 13.7 % (ref 11.5–15.5)
WBC: 5 K/uL (ref 4.0–10.5)
nRBC: 0 % (ref 0.0–0.2)

## 2024-01-30 LAB — URINALYSIS, ROUTINE W REFLEX MICROSCOPIC
Bilirubin Urine: NEGATIVE
Glucose, UA: >=500 mg/dL — AB
Hgb urine dipstick: NEGATIVE
Ketones, ur: NEGATIVE mg/dL
Nitrite: NEGATIVE
Protein, ur: NEGATIVE mg/dL
Specific Gravity, Urine: 1.017 (ref 1.005–1.030)
pH: 6 (ref 5.0–8.0)

## 2024-01-30 LAB — COMPREHENSIVE METABOLIC PANEL WITH GFR
ALT: 56 U/L — ABNORMAL HIGH (ref 0–44)
AST: 31 U/L (ref 15–41)
Albumin: 3.5 g/dL (ref 3.5–5.0)
Alkaline Phosphatase: 211 U/L — ABNORMAL HIGH (ref 38–126)
Anion gap: 9 (ref 5–15)
BUN: 13 mg/dL (ref 8–23)
CO2: 26 mmol/L (ref 22–32)
Calcium: 9.9 mg/dL (ref 8.9–10.3)
Chloride: 99 mmol/L (ref 98–111)
Creatinine, Ser: 0.78 mg/dL (ref 0.44–1.00)
GFR, Estimated: >60 mL/min (ref >60)
Glucose, Bld: 240 mg/dL — ABNORMAL HIGH (ref 70–99)
Potassium: 4.4 mmol/L (ref 3.5–5.1)
Sodium: 134 mmol/L — ABNORMAL LOW (ref 135–145)
Total Bilirubin: 0.5 mg/dL (ref 0.0–1.2)
Total Protein: 7.1 g/dL (ref 6.5–8.1)

## 2024-01-30 LAB — TROPONIN I (HIGH SENSITIVITY)
Troponin I (High Sensitivity): 3 ng/L (ref <18)
Troponin I (High Sensitivity): 4 ng/L (ref <18)

## 2024-01-30 LAB — BRAIN NATRIURETIC PEPTIDE: B Natriuretic Peptide: 25.6 pg/mL (ref 0.0–100.0)

## 2024-01-30 MED ORDER — MECLIZINE HCL 12.5 MG PO TABS: 12.5000 mg | ORAL_TABLET | Freq: Three times a day (TID) | ORAL | 0 refills | Status: AC

## 2024-01-30 MED ORDER — MECLIZINE HCL 25 MG PO TABS
12.5000 mg | ORAL_TABLET | Freq: Once | ORAL | Status: AC
  Administered 2024-01-30: 12.5 mg via ORAL
  Filled 2024-01-30: qty 1

## 2024-01-30 MED ORDER — SODIUM CHLORIDE 0.9 % IV BOLUS
500.0000 mL | Freq: Once | INTRAVENOUS | Status: AC
  Administered 2024-01-30: 500 mL via INTRAVENOUS

## 2024-01-30 NOTE — ED Triage Notes (Addendum)
 [place]. Pt reports allergy sx that have been persistent for the past week along with intermittent dizziness. PT states that today when she stood up to sing, she felt more dizzy than normal. Pt described the entire church was spinning around her, and she had to quickly sit down. Pt endorses a headache across the forehead. Denies chest pain. Denies shortness of breath. Pt states that environment temperature was normal.   EMS glucose 344 mg/dL. EMS VS P-80 BP-130/70 O2-98% RA

## 2024-01-30 NOTE — Discharge Instructions (Addendum)
 Be sure to take all medication as prescribed and follow-up with your primary care physician as well as with your hepatologist.  Return here for concerning changes in your condition.

## 2024-01-30 NOTE — ED Provider Notes (Signed)
 [place] EMERGENCY DEPARTMENT AT [facility] Provider Note   CSN: [id number] Arrival date & time: 01/30/24  1354     Patient presents with: Dizziness   [name] [name] is a 73 y.o. female.   HPI Patient presents with concern of dizziness, cough.  Onset was about 1 week ago, since that time.  Patient persistent, no worsening over the past 48 hours.  No focal weakness, no confusion.  Patient notes that both at rest and with activity she has new dizziness.  Symptoms not improved with rest, positioning, OTC meds.     Prior to Admission medications   Medication Sig Start Date End Date Taking? Authorizing Provider  meclizine  (ANTIVERT ) 12.5 MG tablet Take 1 tablet (12.5 mg total) by mouth 3 (three) times daily for 5 days. 01/30/24 02/04/24 Yes Garrick Charleston, MD  amoxicillin  (AMOXIL ) 500 MG capsule Take by mouth. 04/15/23   [provider]  amoxicillin -clavulanate (AUGMENTIN ) 875-125 MG tablet Take 1 tablet by mouth every 12 (twelve) hours. 10/16/23   White, Shelba SAUNDERS, NP  ascorbic acid (VITAMIN C) 500 MG tablet Take by mouth.    [provider]  aspirin  EC 81 MG tablet Take 81 mg by mouth daily.    [provider]  atorvastatin  (LIPITOR) 20 MG tablet 1 tablet 09/25/21   [provider]  benzonatate  (TESSALON ) 100 MG capsule Take 1 capsule (100 mg total) by mouth every 8 (eight) hours. 10/11/23   Teresa Shelba SAUNDERS, NP  cholecalciferol (VITAMIN D ) 1000 UNITS tablet Take 1,000 Units by mouth 2 (two) times daily.     [provider]  Cyanocobalamin (VITAMIN B 12 PO) Take 1 tablet by mouth daily.    [provider]  EPINEPHrine  0.15 MG/0.15ML IJ injection Inject 0.15 mLs (0.15 mg total) into the muscle as needed for anaphylaxis. Patient not taking: Reported on 10/27/2021 04/03/18   Doretha Folks, MD  fluticasone  (FLONASE ) 50 MCG/ACT nasal spray Place 1 spray into both nostrils daily. 02/02/23   Hazen Darryle BRAVO, FNP  glucose  blood test strip     [provider]  ibuprofen (ADVIL) 800 MG tablet TAKE 1 TABLET BY MOUTH WITH FOOD AS NEEDED FOR PAIN    [provider]  lisinopril  (ZESTRIL ) 10 MG tablet Take 1 tablet (10 mg total) by mouth daily. 01/09/23 02/08/23  Zelaya, Oscar A, PA-C  losartan  (COZAAR ) 25 MG tablet TAKE 1 TAB BY MOUTH ONCE DAILY FOR GOUT PREVENTION 03/19/22   [provider]  metFORMIN  (GLUCOPHAGE ) 1000 MG tablet Take 1,000 mg by mouth 2 (two) times daily with a meal.    [provider]  nitroGLYCERIN  (NITROSTAT ) 0.4 MG SL tablet Place 1 tablet (0.4 mg total) under the tongue every 5 (five) minutes x 3 doses as needed for chest pain. Patient not taking: Reported on 10/27/2021 06/20/19   Claudene Pacific, MD  predniSONE  (STERAPRED UNI-PAK 21 TAB) 10 MG (21) TBPK tablet Take by mouth daily. Take 6 tabs by mouth daily  for 1 days, then 5 tabs for 1 days, then 4 tabs for 1 days, then 3 tabs for 1 days, 2 tabs for 1 days, then 1 tab by mouth daily for 1 days 10/11/23   Teresa Shelba SAUNDERS, NP  promethazine -dextromethorphan (PROMETHAZINE -DM) 6.25-15 MG/5ML syrup Take 5 mLs by mouth at bedtime as needed for cough. 10/11/23   White, Adrienne R, NP  TRULICITY 0.75 MG/0.5ML SOPN Inject into the skin. 08/20/21   [provider]    Allergies: Soledad  venom and Empagliflozin    Review of Systems  Updated Vital Signs BP 127/71 (BP Location: Right Arm)   Pulse 83   Temp 98 F (36.7 C) (Oral)   Resp 11   Ht 5' 4 (1.626 m)   Wt 68 kg   SpO2 100%   BMI 25.75 kg/m   Physical Exam Vitals and nursing note reviewed.  Constitutional:      General: She is not in acute distress.    Appearance: She is well-developed.  HENT:     Head: Normocephalic and atraumatic.  Eyes:     Conjunctiva/sclera: Conjunctivae normal.  Cardiovascular:     Rate and Rhythm: Normal rate and regular rhythm.  Pulmonary:     Effort: Pulmonary effort is normal. No respiratory distress.     Breath sounds:  Normal breath sounds. No stridor.  Abdominal:     General: There is no distension.  Skin:    General: Skin is warm and dry.  Neurological:     Mental Status: She is alert and oriented to person, place, and time.     Cranial Nerves: No cranial nerve deficit, dysarthria or facial asymmetry.  Psychiatric:        Mood and Affect: Mood normal.     (all labs ordered are listed, but only abnormal results are displayed) Labs Reviewed  COMPREHENSIVE METABOLIC PANEL WITH GFR - Abnormal; Notable for the following components:      Result Value   Sodium 134 (*)    Glucose, Bld 240 (*)    ALT 56 (*)    Alkaline Phosphatase 211 (*)    All other components within normal limits  URINALYSIS, ROUTINE W REFLEX MICROSCOPIC - Abnormal; Notable for the following components:   Glucose, UA >=500 (*)    Leukocytes,Ua MODERATE (*)    Bacteria, UA RARE (*)    All other components within normal limits  BRAIN NATRIURETIC PEPTIDE  CBC WITH DIFFERENTIAL/PLATELET  TROPONIN I (HIGH SENSITIVITY)  TROPONIN I (HIGH SENSITIVITY)    EKG: EKG Interpretation Date/Time:  Sunday January 30 2024 15:37:44 EDT Ventricular Rate:  79 PR Interval:  188 QRS Duration:  89 QT Interval:  376 QTC Calculation: 431 R Axis:   -34  Text Interpretation: Sinus rhythm Left axis deviation Low voltage, precordial leads Abnormal R-wave progression, late transition Confirmed by Garrick Charleston [phone]) on 01/30/2024 3:49:10 PM  Radiology: DG Chest 2 View Result Date: 01/30/2024 CLINICAL DATA:  Cough, intermittent dizziness EXAM: CHEST - 2 VIEW COMPARISON:  10/13/2023 FINDINGS: Frontal and lateral views of the chest demonstrate an unremarkable cardiac silhouette. Postsurgical changes from left mastectomy and reconstruction. No acute airspace disease, effusion, or pneumothorax. No acute bony abnormalities. IMPRESSION: 1. No acute intrathoracic process. Electronically Signed   By: Ozell Daring M.D.   On: 01/30/2024 15:10   CT Head Wo  Contrast Result Date: 01/30/2024 CLINICAL DATA:  Possible stroke.  Neurologic deficit. EXAM: CT HEAD WITHOUT CONTRAST TECHNIQUE: Contiguous axial images were obtained from the base of the skull through the vertex without intravenous contrast. RADIATION DOSE REDUCTION: This exam was performed according to the departmental dose-optimization program which includes automated exposure control, adjustment of the mA and/or kV according to patient size and/or use of iterative reconstruction technique. COMPARISON:  10/13/2023 FINDINGS: Brain: The ventricles, cisterns and other CSF spaces are normal. There is no mass, mass effect, shift of midline structures or acute hemorrhage. No evidence of acute infarction. Stable chronic change right basal ganglia. Vascular: No hyperdense vessel or unexpected  calcification. Skull: Normal. Negative for fracture or focal lesion. Sinuses/Orbits: No acute finding. Other: None. IMPRESSION: No acute findings. Electronically Signed   By: Toribio Agreste M.D.   On: 01/30/2024 15:01     Procedures   Medications Ordered in the ED  meclizine  (ANTIVERT ) tablet 12.5 mg (has no administration in time range)  sodium chloride  0.9 % bolus 500 mL (0 mLs Intravenous Stopped 01/30/24 1610)                                    Medical Decision Making Elderly female with distant history of breast cancer, prior evaluations for dizziness presents with cough, dizziness.  She is awake, alert, hemodynamically unremarkable, speaking clearly, describes 1 week of illness.  Broad differential including intracranial mass, prior stroke, infectious, electrolyte abnormalities.   Amount and/or Complexity of Data Reviewed External Data Reviewed: notes.    Details: Eval April this year reviewed Labs: ordered. Decision-making details documented in ED Course. Radiology: ordered and independent interpretation performed. Decision-making details documented in ED Course. ECG/medicine tests: ordered and independent  interpretation performed. Decision-making details documented in ED Course.  Risk Prescription drug management. Decision regarding hospitalization. Diagnosis or treatment significantly limited by social determinants of health.   5:38 PM Patient in no distress, smiling.  She is accompanied by her daughter.  In discussing possibilities for her dizziness we discussed worsening mash patient will follow-up with her hepatologist.  Patient also has history of vertigo and describes the symptoms as similar to prior episodes, though not as pronounced. No evidence for other acute intracranial pathology, reassuring neuroexam no evidence for pneumonia, or other infection, patient, will discharge with follow-up as an outpatient.  Final diagnoses:  Dizziness  Elevated alkaline phosphatase level    ED Discharge Orders          Ordered    meclizine  (ANTIVERT ) 12.5 MG tablet  3 times daily        01/30/24 1738               Garrick Charleston, MD 01/30/24 1739

## 2024-01-30 NOTE — ED Notes (Signed)
 Discharge instructions, medications, and follow up care reviewed with and provided to pt. Pt denies any further questions, and has verbalized understanding.

## 2024-03-07 ENCOUNTER — Other Ambulatory Visit: Payer: Self-pay | Admitting: Nurse Practitioner

## 2024-03-07 DIAGNOSIS — K7469 Other cirrhosis of liver: Secondary | ICD-10-CM

## 2024-03-10 ENCOUNTER — Other Ambulatory Visit: Payer: Self-pay | Admitting: Nurse Practitioner

## 2024-03-10 DIAGNOSIS — K7469 Other cirrhosis of liver: Secondary | ICD-10-CM

## 2024-03-10 DIAGNOSIS — R748 Abnormal levels of other serum enzymes: Secondary | ICD-10-CM

## 2024-03-10 DIAGNOSIS — K7581 Nonalcoholic steatohepatitis (NASH): Secondary | ICD-10-CM

## 2024-03-15 ENCOUNTER — Ambulatory Visit
Admission: RE | Admit: 2024-03-15 | Discharge: 2024-03-15 | Disposition: A | Discharge status: Home / Self Care | Source: Ambulatory Visit | Attending: Nurse Practitioner | Admitting: Nurse Practitioner

## 2024-03-15 DIAGNOSIS — R748 Abnormal levels of other serum enzymes: Secondary | ICD-10-CM

## 2024-03-15 DIAGNOSIS — K7469 Other cirrhosis of liver: Secondary | ICD-10-CM

## 2024-03-15 DIAGNOSIS — K7581 Nonalcoholic steatohepatitis (NASH): Secondary | ICD-10-CM

## 2024-03-22 ENCOUNTER — Other Ambulatory Visit: Payer: Self-pay | Admitting: Nurse Practitioner

## 2024-03-22 DIAGNOSIS — R748 Abnormal levels of other serum enzymes: Secondary | ICD-10-CM

## 2024-03-22 DIAGNOSIS — K7469 Other cirrhosis of liver: Secondary | ICD-10-CM

## 2024-04-18 ENCOUNTER — Other Ambulatory Visit

## 2024-05-09 ENCOUNTER — Ambulatory Visit
Admission: RE | Admit: 2024-05-09 | Discharge: 2024-05-09 | Disposition: A | Discharge status: Home / Self Care | Source: Ambulatory Visit | Attending: Nurse Practitioner | Admitting: Nurse Practitioner

## 2024-05-09 DIAGNOSIS — R748 Abnormal levels of other serum enzymes: Secondary | ICD-10-CM

## 2024-05-09 DIAGNOSIS — K7469 Other cirrhosis of liver: Secondary | ICD-10-CM

## 2024-05-09 MED ORDER — GADOPICLENOL 0.5 MMOL/ML IV SOLN
7.0000 mL | Freq: Once | INTRAVENOUS | Status: AC | PRN
  Administered 2024-05-09: 7 mL via INTRAVENOUS

## 2024-06-09 ENCOUNTER — Other Ambulatory Visit: Payer: Self-pay | Admitting: Family Medicine

## 2024-06-09 DIAGNOSIS — Z1231 Encounter for screening mammogram for malignant neoplasm of breast: Secondary | ICD-10-CM

## 2024-07-11 ENCOUNTER — Ambulatory Visit

## 2024-07-11 ENCOUNTER — Encounter
